# Patient Record
Sex: Female | Born: 1960 | Race: White | Hispanic: No | Marital: Married | Smoking: Never smoker
Health system: Southern US, Community
[De-identification: ages and names within clinical notes are randomized; demographics above are authoritative.]

## PROBLEM LIST (undated history)

## (undated) DIAGNOSIS — K219 Gastro-esophageal reflux disease without esophagitis: Secondary | ICD-10-CM

## (undated) DIAGNOSIS — R52 Pain, unspecified: Secondary | ICD-10-CM

## (undated) DIAGNOSIS — F419 Anxiety disorder, unspecified: Secondary | ICD-10-CM

## (undated) DIAGNOSIS — R112 Nausea with vomiting, unspecified: Secondary | ICD-10-CM

## (undated) DIAGNOSIS — T8859XA Other complications of anesthesia, initial encounter: Secondary | ICD-10-CM

## (undated) DIAGNOSIS — R51 Headache: Secondary | ICD-10-CM

## (undated) DIAGNOSIS — E669 Obesity, unspecified: Secondary | ICD-10-CM

## (undated) DIAGNOSIS — T4145XA Adverse effect of unspecified anesthetic, initial encounter: Secondary | ICD-10-CM

## (undated) DIAGNOSIS — Z9889 Other specified postprocedural states: Secondary | ICD-10-CM

## (undated) DIAGNOSIS — M549 Dorsalgia, unspecified: Secondary | ICD-10-CM

## (undated) DIAGNOSIS — K8020 Calculus of gallbladder without cholecystitis without obstruction: Secondary | ICD-10-CM

## (undated) DIAGNOSIS — B009 Herpesviral infection, unspecified: Secondary | ICD-10-CM

## (undated) DIAGNOSIS — I878 Other specified disorders of veins: Secondary | ICD-10-CM

## (undated) DIAGNOSIS — F909 Attention-deficit hyperactivity disorder, unspecified type: Secondary | ICD-10-CM

## (undated) DIAGNOSIS — E039 Hypothyroidism, unspecified: Secondary | ICD-10-CM

## (undated) DIAGNOSIS — E538 Deficiency of other specified B group vitamins: Secondary | ICD-10-CM

## (undated) DIAGNOSIS — J302 Other seasonal allergic rhinitis: Secondary | ICD-10-CM

## (undated) HISTORY — DX: Deficiency of other specified B group vitamins: E53.8

## (undated) HISTORY — PX: ENDOMETRIAL ABLATION: SHX621

## (undated) HISTORY — DX: Dorsalgia, unspecified: M54.9

## (undated) HISTORY — PX: OTHER SURGICAL HISTORY: SHX169

## (undated) HISTORY — DX: Herpesviral infection, unspecified: B00.9

---

## 1998-04-26 ENCOUNTER — Ambulatory Visit (HOSPITAL_COMMUNITY): Admission: RE | Admit: 1998-04-26 | Discharge: 1998-04-26 | Payer: Self-pay | Admitting: Family Medicine

## 1998-05-06 ENCOUNTER — Observation Stay (HOSPITAL_COMMUNITY): Admission: RE | Admit: 1998-05-06 | Discharge: 1998-05-07 | Payer: Self-pay | Admitting: Gynecology

## 1999-04-22 ENCOUNTER — Ambulatory Visit (HOSPITAL_COMMUNITY): Admission: RE | Admit: 1999-04-22 | Discharge: 1999-04-22 | Payer: Self-pay | Admitting: Family Medicine

## 1999-04-22 ENCOUNTER — Encounter: Payer: Self-pay | Admitting: Family Medicine

## 2000-08-06 ENCOUNTER — Other Ambulatory Visit: Admission: RE | Admit: 2000-08-06 | Discharge: 2000-08-06 | Payer: Self-pay | Admitting: Gynecology

## 2000-08-13 ENCOUNTER — Ambulatory Visit (HOSPITAL_COMMUNITY): Admission: RE | Admit: 2000-08-13 | Discharge: 2000-08-13 | Payer: Self-pay | Admitting: Family Medicine

## 2000-08-13 ENCOUNTER — Encounter: Payer: Self-pay | Admitting: Family Medicine

## 2002-03-27 ENCOUNTER — Encounter: Admission: RE | Admit: 2002-03-27 | Discharge: 2002-03-27 | Payer: Self-pay | Admitting: Gynecology

## 2002-03-27 ENCOUNTER — Encounter: Payer: Self-pay | Admitting: Gynecology

## 2002-12-22 ENCOUNTER — Other Ambulatory Visit: Admission: RE | Admit: 2002-12-22 | Discharge: 2002-12-22 | Payer: Self-pay | Admitting: Gynecology

## 2003-03-13 ENCOUNTER — Emergency Department (HOSPITAL_COMMUNITY): Admission: EM | Admit: 2003-03-13 | Discharge: 2003-03-13 | Payer: Self-pay

## 2003-12-10 ENCOUNTER — Other Ambulatory Visit: Admission: RE | Admit: 2003-12-10 | Discharge: 2003-12-10 | Payer: Self-pay | Admitting: Gynecology

## 2003-12-15 ENCOUNTER — Encounter: Admission: RE | Admit: 2003-12-15 | Discharge: 2003-12-15 | Payer: Self-pay

## 2003-12-29 ENCOUNTER — Ambulatory Visit (HOSPITAL_COMMUNITY): Admission: RE | Admit: 2003-12-29 | Discharge: 2003-12-29 | Payer: Self-pay | Admitting: General Surgery

## 2003-12-29 ENCOUNTER — Encounter (INDEPENDENT_AMBULATORY_CARE_PROVIDER_SITE_OTHER): Payer: Self-pay | Admitting: Specialist

## 2004-02-15 ENCOUNTER — Ambulatory Visit (HOSPITAL_COMMUNITY): Admission: RE | Admit: 2004-02-15 | Discharge: 2004-02-15 | Payer: Self-pay | Admitting: General Surgery

## 2004-04-01 ENCOUNTER — Encounter (INDEPENDENT_AMBULATORY_CARE_PROVIDER_SITE_OTHER): Payer: Self-pay | Admitting: Specialist

## 2004-04-01 ENCOUNTER — Ambulatory Visit (HOSPITAL_COMMUNITY): Admission: RE | Admit: 2004-04-01 | Discharge: 2004-04-01 | Payer: Self-pay | Admitting: General Surgery

## 2004-05-12 ENCOUNTER — Encounter: Admission: RE | Admit: 2004-05-12 | Discharge: 2004-05-12 | Payer: Self-pay | Admitting: Gynecology

## 2004-06-02 ENCOUNTER — Encounter (INDEPENDENT_AMBULATORY_CARE_PROVIDER_SITE_OTHER): Payer: Self-pay | Admitting: Specialist

## 2004-06-02 ENCOUNTER — Ambulatory Visit (HOSPITAL_COMMUNITY): Admission: RE | Admit: 2004-06-02 | Discharge: 2004-06-02 | Payer: Self-pay | Admitting: General Surgery

## 2004-08-22 ENCOUNTER — Ambulatory Visit (HOSPITAL_COMMUNITY): Admission: RE | Admit: 2004-08-22 | Discharge: 2004-08-22 | Payer: Self-pay | Admitting: Gastroenterology

## 2004-12-27 ENCOUNTER — Other Ambulatory Visit: Admission: RE | Admit: 2004-12-27 | Discharge: 2004-12-27 | Payer: Self-pay | Admitting: Gynecology

## 2005-04-27 ENCOUNTER — Encounter (INDEPENDENT_AMBULATORY_CARE_PROVIDER_SITE_OTHER): Payer: Self-pay | Admitting: *Deleted

## 2005-04-27 ENCOUNTER — Ambulatory Visit (HOSPITAL_COMMUNITY): Admission: RE | Admit: 2005-04-27 | Discharge: 2005-04-27 | Payer: Self-pay | Admitting: Gynecology

## 2005-04-27 ENCOUNTER — Ambulatory Visit (HOSPITAL_BASED_OUTPATIENT_CLINIC_OR_DEPARTMENT_OTHER): Admission: RE | Admit: 2005-04-27 | Discharge: 2005-04-27 | Payer: Self-pay | Admitting: Gynecology

## 2005-06-30 ENCOUNTER — Encounter: Admission: RE | Admit: 2005-06-30 | Discharge: 2005-06-30 | Payer: Self-pay | Admitting: Gynecology

## 2005-12-28 ENCOUNTER — Other Ambulatory Visit: Admission: RE | Admit: 2005-12-28 | Discharge: 2005-12-28 | Payer: Self-pay | Admitting: Gynecology

## 2006-07-02 ENCOUNTER — Encounter: Admission: RE | Admit: 2006-07-02 | Discharge: 2006-07-02 | Payer: Self-pay | Admitting: *Deleted

## 2006-12-31 ENCOUNTER — Other Ambulatory Visit: Admission: RE | Admit: 2006-12-31 | Discharge: 2006-12-31 | Payer: Self-pay | Admitting: Gynecology

## 2007-01-29 ENCOUNTER — Ambulatory Visit: Payer: Self-pay | Admitting: Psychology

## 2007-07-05 ENCOUNTER — Encounter: Admission: RE | Admit: 2007-07-05 | Discharge: 2007-07-05 | Payer: Self-pay | Admitting: Gynecology

## 2008-07-07 ENCOUNTER — Encounter: Admission: RE | Admit: 2008-07-07 | Discharge: 2008-07-07 | Payer: Self-pay | Admitting: Family Medicine

## 2009-07-16 ENCOUNTER — Encounter: Admission: RE | Admit: 2009-07-16 | Discharge: 2009-07-16 | Payer: Self-pay | Admitting: Gynecology

## 2010-07-21 ENCOUNTER — Encounter: Admission: RE | Admit: 2010-07-21 | Discharge: 2010-07-21 | Payer: Self-pay | Admitting: Gynecology

## 2010-07-27 ENCOUNTER — Encounter: Admission: RE | Admit: 2010-07-27 | Discharge: 2010-07-27 | Payer: Self-pay | Admitting: Family Medicine

## 2011-02-24 NOTE — Op Note (Signed)
NAME:  Bonnie Schwartz, Bonnie Schwartz             ACCOUNT NO.:  [id number]   MEDICAL RECORD NO.:  [id number]          PATIENT TYPE:  AMB   LOCATION:  ENDO                         FACILITY:  [facility]   PHYSICIAN:  John C. Madilyn Fireman, M.D.    DATE OF BIRTH:  [birth year]   DATE OF PROCEDURE:  08/22/2004  DATE OF DISCHARGE:                                 OPERATIVE REPORT   ENDOSCOPIST:  Everardo All. Madilyn Fireman, M.D.   PROCEDURE:  Colonoscopy.   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  The patient is to undergo  corrective surgery at [facility].  The procedure is requested based on rectal  bleeding and also to assess for possibility of inflammatory bowel disease.   PROCEDURE:  The patient was placed in the left lateral decubitus position  and placed on the pulse monitor with continuous low flow oxygen delivered by  nasal cannula.  She was sedated with 100 mcg of IV fentanyl and 8 mg of IV  Versed.  The Olympus video colonoscope was inserted into the rectum and  advanced to the cecum, confirmed by transillumination of McBurney's point  and visualization of the ileocecal valve and appendiceal orifice.  The prep  was excellent.  The terminal ileum was intubated and explored for several cm  and appeared to be within normal limits.  The cecum, ascending, transverse,  descending, sigmoid colon all appeared normal with no masses, polyps,  diverticula or other mucosal abnormalities.  The rectum, likewise, appeared  normal.  In retroflexed view, the anus revealed no obvious internal  hemorrhoids, no fistulas were seen internally within the rectum, however, on  withdrawal of the scope, as the scope was being withdrawn from the anal  sphincter, small fistula presumably to the vagina could be seen.  We did not  attempt to probe this with the endoscope.  There were also some small  external hemorrhoids.  The scope was then withdrawn and the patient was  returned to  the recovery room in stable condition.  She tolerated the  procedure well and there were no immediate complications.   IMPRESSION:  Very distal rectovaginal fistula, otherwise, normal  colonoscopy.  No direct evidence of Crohn's disease including the terminal  ileum.      JCH/MEDQ  D:  08/22/2004  T:  08/22/2004  Job:  [id number]   cc:   Sheppard Plumber. Earlene Plater, M.D.  [address]  [place]  [place] [address]  Fax: [phone]   Gretta Cool, M.D.  [address] [place]  [place] [address]  Fax: [phone]   Lafe Garin  [facility] - [address]  [place]  [place] [address]  Fax: [phone]

## 2011-02-24 NOTE — Op Note (Signed)
NAME:  Bonnie Schwartz, Bonnie Schwartz                       ACCOUNT NO.:  [id number]   MEDICAL RECORD NO.:  [id number]                   PATIENT TYPE:  AMB   LOCATION:  DAY                                  FACILITY:  [facility]   PHYSICIAN:  Timothy E. Earlene Plater, M.D.              DATE OF BIRTH:  Jul 28, 1961   DATE OF PROCEDURE:  02/15/2004  DATE OF DISCHARGE:                                 OPERATIVE REPORT   PREOPERATIVE DIAGNOSES:  Persistent perianal sepsis.   POSTOPERATIVE DIAGNOSES:  Rectal abscess, probable fistula-in-ano.   PROCEDURE:  Examination under anesthesia with ultrasound and external  drainage of abscess.   SURGEON:  Timothy E. Earlene Plater, M.D.   ANESTHESIA:  General.   INDICATIONS FOR PROCEDURE:  [name] is otherwise healthy, 24.  The  patient was seen in February, treated temporarily for a fissure, sepsis  developed, she was operated December 29, 2003 with debridement of fistula.  The  patient had been seen and followed closely, has developed more drainage with  discomfort, questionable masses present in the  perirectal area and this  surgery was advised.  The patient was seen, identified, and the permit  signed.   The patient was taken to the operating room, placed supine, LMA anesthesia  provided. She was placed in lithotomy, perianal area inspected, prepped and  draped in the usual fashion.  I used a loupe for visual magnification.  I  carefully examined the side walls of the posterior wall of the vagina which  was completely intact and normal. Examination of  the perianal skin revealed  it to be intact without external fistula. There was a palpable mass in the  left anterior perirectal tissue.  Anoscopy carried out showing the old  fistula tract laid open, the mucosa separated and the wound granulated and  cleaned.  However, moderate pressure on the left anterior perirectal skin  produced a small droplet of pus in the central part of the fistula tract. I  then performed anorectal  ultrasonography with multiple techniques and views.  Careful examination of these ultrasound photographs revealed no  abnormalities that I could see.   The ultrasound probe removed and then with anoscopy again, the rectal area  in the left anterior where the fistula tract appeared to be superficially  healing and granulating was again inspected, gentle probing in this area  revealed no evidence of fistula deep to the tissue.  I then made a small  incision in the external area and no pus was seen. I injected hydrogen  peroxide under pressure and none of that leaked into the rectal vault.  I  therefore extended that incision,bluntly probed this area outside the  sphincter and placed a 10 mm drain in this area and sutured it to the skin.  There was no bleeding or complications.  The dressing was applied and the  patient was taken to the recovery room.   I explained all of this  in detail to her husband with whom I have talked  before.  She will be given Augmentin 875, 1 b.i.d. for 10 days, 1 refill;  Percocet #36 to use as needed and will be seen in the office in 7-10 days.                                               Timothy E. Earlene Plater, M.D.    TED/MEDQ  D:  02/15/2004  T:  02/15/2004  Job:  [id number]   cc:   Gretta Cool, M.D.  [address] [place]  [place] [address]  Fax: [phone]

## 2011-02-24 NOTE — Op Note (Signed)
NAME:  Bonnie Schwartz, Bonnie Schwartz NO.:  [id number]   MEDICAL RECORD NO.:  [id number]          PATIENT TYPE:  AMB   LOCATION:  NESC                         FACILITY:  [facility]   PHYSICIAN:  Gretta Cool, M.D. DATE OF BIRTH:  10/22/1960   DATE OF PROCEDURE:  04/27/2005  DATE OF DISCHARGE:                                 OPERATIVE REPORT   PREOPERATIVE DIAGNOSIS:  Abnormal uterine bleeding, recurrent and  unresponsive to conservative therapy.   POSTOPERATIVE DIAGNOSIS:  Abnormal uterine bleeding, recurrent and  unresponsive to conservative therapy.   PROCEDURE:  Hysteroscopy and resection of endometrium total for ablation  plus Vapotrode ablation.   ANESTHESIA:  IV sedation and paracervical block.   SURGEON:  Beather Arbour, M.D.   DESCRIPTION OF PROCEDURE:  Under excellent anesthesia as above with the  patient prepped and draped in lithotomy position in [place] stirrups, the  cervix was grasped with a single-toothed tenaculum and pulled down into  view.  It was then progressively dilated with a series of Pratt dilators to  accommodate the 7-mm resectoscope.  The resectoscope was then introduced,  and the entire cavity photographed.  There was a polyp on the right lateral  fundal wall, somewhat broad and sessile.  The endometrial cavity was then  resected beginning in the cornual areas and continuing across the anterior  uterine wall until all of the polyp and endometrial tissue was resected down  5 mm or so into the endometrial tissue and myometrial tissue.  Once the  entire myometrium was resected, attention was turned to the bleeding sites,  and they were individually cauterized.  The Vapotrode electrode was then  applied, and the entire cavity treated with Vapotrode at 200 watts pure cut.  At the end of the procedure, the bleeding was well-controlled.  The entire  cavity had been treated and her all of endometrial tissue removed, and  Vapotrode used to eliminate any  viable islands of  endometrial tissue and  the surface myometrium.  At this point, the procedure was terminated without  complication.  Fluid deficit was 175 mL. Complications none.       CWL/MEDQ  D:  04/27/2005  T:  04/27/2005  Job:  [id number]   cc:   [facility]

## 2011-02-24 NOTE — Op Note (Signed)
NAME:  Bonnie Schwartz, Bonnie Schwartz                       ACCOUNT NO.:  [id number]   MEDICAL RECORD NO.:  [id number]                   PATIENT TYPE:  AMB   LOCATION:  DAY                                  FACILITY:  [facility]   PHYSICIAN:  Timothy E. Earlene Plater, M.D.              DATE OF BIRTH:  30-Sep-1961   DATE OF PROCEDURE:  06/02/2004  DATE OF DISCHARGE:                                 OPERATIVE REPORT   PREOPERATIVE DIAGNOSIS:  Persistent rectovaginal fistula.   POSTOPERATIVE DIAGNOSIS:  Persistent rectovaginal fistula.   OPERATIVE PROCEDURE:  1. Examination under anesthesia.  2. Open debridement and partial excision of fistula.  3. Mucosal advancement flap of rectum.   SURGEON:  Kendrick Ranch, M.D.   ANESTHESIA:  General.   INDICATIONS FOR PROCEDURE:  Bonnie Schwartz has been seen and followed for almost  1 year for persistent perianal sepsis.  Two prior procedures by me have been  frustrating and uneventful in that we have not been able to define the  fistula; however, the patient [place].  She was seen by Dr. Nicholas Lose a week ago and underwent  incision and drainage of an external abscess in the perineum.  I saw her in  the office 1 week ago.  She was to complete her course of antibiotics, being  Cipro and Flagyl and when seen yesterday in the office, there was clear  fluctuance in the perineum, a clear pustular discharge from the vagina, and  an obvious pustular discharge from the anterior rectal wall.  With these  findings, surgery was scheduled urgently as the fistula tract could now be  defined.  She agrees and understands and realizes injury to the anal rectal  sphincter mechanism, both by the infection and potentially by the surgery.  The patient was seen and identified and the permit signed.   She was taken to the operating room, placed supine, LMA anesthesia provided.  She was then placed in the lithotomy position, and the entire  perineum,  vagina, and rectal vault were prepped with Betadine.  Inspection into the  vagina revealed a punctate area just to the left of the midline in the  posterior vagina approximately 3 cm from the introitus.  A malleable probe  was placed, and it fell into a fistula tract.  I could not get it to pass  into the rectum and so then using a standard anoscope, the anterior rectal  wall just to the left of the midline was purulent, obviously had been  involved with an infectious process.  There was a 1 cm area of mucosa that  was partially necrotic.  Gentle probing with a hemostat revealed the fistula  tract and the malleable probe from the vagina fell through completing the  course of the rectovaginal fistula.   I then turned attention to the vagina where I opened the vaginal wall out  into the perineum and entered into a 1-1/2 cm wide in diameter fistula  cavity.  This area was scraped and debrided with bone curettes as deeply as  I could safely reach.  I then turned to the rectum with the operating  anoscope in place.  I excised the necrotic rectal mucosa.  I raised flaps of  the mucosa proximally and on either side and then was able to actually close  the rectal mucosa over this area of previous necrosis as a mucosal  advancement flap.  The sutures used were 4-0 Vicryl.  This was intact and  dry.  Then again from the vaginal side, I further debrided the fistula  tract.  I placed a blue vessel loop into the fistula cavity and sutured that  to the vaginal mucosa.  I then closed the perineal wound in layers.   So that the remaining fistula tract was open to the vagina with a loop drain  in place, the rectal portion was closed with a mucosal advancement flap.  Both areas were dressed with Gelfoam and dry sterile dressings.  The patient  has pain medication.  She will be placed on antibiotics and followed closely  as an outpatient.                                               Timothy  E. Earlene Plater, M.D.    TED/MEDQ  D:  06/02/2004  T:  06/02/2004  Job:  [id number]

## 2011-02-24 NOTE — Op Note (Signed)
NAME:  Bonnie Schwartz, Bonnie Schwartz                     ACCOUNT NO.:  [id number]   MEDICAL RECORD NO.:  [id number]                   PATIENT TYPE:  AMB   LOCATION:  DAY                                  FACILITY:  [facility]   PHYSICIAN:  Timothy E. Earlene Plater, M.D.              DATE OF BIRTH:  [birth year]   DATE OF PROCEDURE:  12/29/2003  DATE OF DISCHARGE:  12/29/2003                                 OPERATIVE REPORT   PREOPERATIVE DIAGNOSES:  Anal fissure versus anal fistula.   POSTOPERATIVE DIAGNOSES:  Fistula-in-ano.   OPERATION:  Examination under anesthesia, proctoscopy, debridement of  fistula.   SURGEON:  Timothy E. Earlene Plater, M.D.   ANESTHESIA:  General.   [name] is 50, has been seen and followed by Dr. Nicholas Lose and has been seen  in our office by Dr. Orson Slick.  Evaluation done, CT scan done unrevealing for  rectal pathology. Because of exquisite tenderness, she could not be examined  in the office and after careful discussion with me, she elected to proceed  with this procedure. She was seen, identified and evaluated by anesthesia.   The patient was taken to the operating room, placed supine, LMA anesthesia  provided. She was placed in lithotomy, perianal area carefully inspected,  prepped and draped in the usual fashion.  There was a lesion in the left  anterior anoderm and perianal skin.  By palpation, there was no abscess.  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 Proctoscopy was  carried out to 18 cm and was otherwise negative.  The operating anoscope  placed into the anal canal. There was no other pathology except in the left  anterior position. This superficial fistula was carefully examined for  evidence of other arborizing fistula, there were none, there was no abscess.  Though the patient had considerable symptoms and findings of sphincter  spasm, under the circumstances I elected not to do an internal  sphincterotomy.  I simply debrided the fistula, cauterized its  base,  provided it an external drainage with the cauterization and completed the  procedure. Tissue was sent for pathology.   A dry sterile dressing and Gelfoam were applied.  Careful written and verbal  instructions including Percocet #36 and the patient will be seen and  followed as an outpatient.                                               Timothy E. Earlene Plater, M.D.    TED/MEDQ  D:  01/12/2004  T:  01/12/2004  Job:  [id number]   cc:   Gretta Cool, M.D.  [address] [place]  [place] [address]  Fax: [phone]

## 2011-02-24 NOTE — Op Note (Signed)
NAME:  Bonnie Schwartz, Bonnie Schwartz                       ACCOUNT NO.:  [id number]   MEDICAL RECORD NO.:  [id number]                   PATIENT TYPE:  AMB   LOCATION:  DAY                                  FACILITY:  [facility]   PHYSICIAN:  Timothy E. Earlene Plater, M.D.              DATE OF BIRTH:  29-Jul-1961   DATE OF PROCEDURE:  04/01/2004  DATE OF DISCHARGE:                                 OPERATIVE REPORT   PREOPERATIVE DIAGNOSIS:  Recurrent fistula versus abscess of the rectum.   POSTOPERATIVE DIAGNOSIS:  Intrarectal abscess.   OPERATIVE PROCEDURE:  1. Examination under anesthesia.  2. Anorectal ultrasonography.  3. Debridement of intrarectal abscess.   SURGEON:  Kendrick Ranch, M.D.   ANESTHESIA:  General.   INDICATIONS FOR PROCEDURE:  Bonnie Schwartz has been seen and followed in my  office since March 2005 and has previously undergone two operative  procedures, first in March and then in May for persistent sepsis perianal.  On each occasion, an intrarectal abscess and/or internal fistulous opening  was found in the left anterior rectal wall and on two occasions external  abscess was found, diagnosed, and drained.  She has been on multiple  antibiotic courses and has provided good local care.  When last seen in the  office at the end of May, she had another external abscess in the left  anterior position but because she had to leave town for a funeral, she was  placed on antibiotics and local care, and this surgery was tentatively  scheduled.  Because of persistence of symptoms, she is here now for exam  under anesthesia, debridement as indicated.  She agrees and understands the  procedures, specifically in regards to the sphincter and the difficulty of  the surgery in this location because of the anorectal sphincter.  It is  notable that she also had a vaginoplasty with reconstruction approximately 6  years ago.  There has been no evidence of rectovaginal fistula at this time.   The patient was seen,  interviewed, and the permit signed.   She was taken to the operating room, placed supine, LMA anesthesia provided.  She was placed in lithotomy.  Perianal area was inspected first then prepped  and draped in the usual fashion.  There was a small, fine, well-healed scar  in the left anterior perianal skin.  There was no fullness, firmness, or  mass appreciated in this ara.  Careful inspection of the vagina again was  completely normal, showing no defects in the vaginal mucosa.  The anorectal  exam was carried out first with digital then anoscopic exam.  Between the 12  and 1 o'clock positions in the left anterior, there was an active, open  internal fistula versus abscess.  This area was gently probed with a  malleable probe and would go no deeper than 1-2 mm.  Pressure on the left  anterior external site revealed no evidence of pus or pustule  material.  I  then removed the anoscope and placed the anorectal ultrasound, both with a  solid anal probe and the balloon rectal probe and could detect no defects in  the mucosa, no evidence for abscesses or fistula.  Multiple pictures were  made with several techniques, and no defects were seen.  The ultrasound  probe was removed.  The anoscope was reintroduced and, again, gentle  debridement of the intrarectal abscess was accomplished.  Small fragments of  tissue that were debrided were sent to the laboratory for pathologic  diagnosis.  Again, the probe would not penetrate more than now 2-3 mm deep.  It did not probe into the left anterior perianal space and did not probe  into the vaginal area.  Therefore, the procedure was complete, as planned,  according to our understanding with the patient and myself that we would do  no radical surgery; we would not injure the sphincter and if less evidence  proved there to be a fistulous tract, we would not create one.  Counts were  correct.  She tolerated it well.  She was awakened and taken to the  recovery  room in good condition.   She was given instructions, Mepergan Forte for pain, and to continue  Augmentin, and will be followed in the office.                                               Timothy E. Earlene Plater, M.D.    TED/MEDQ  D:  04/01/2004  T:  04/01/2004  Job:  [id number]

## 2011-06-20 ENCOUNTER — Other Ambulatory Visit: Payer: Self-pay | Admitting: Family Medicine

## 2011-06-20 DIAGNOSIS — Z1231 Encounter for screening mammogram for malignant neoplasm of breast: Secondary | ICD-10-CM

## 2011-07-25 ENCOUNTER — Ambulatory Visit
Admission: RE | Admit: 2011-07-25 | Discharge: 2011-07-25 | Disposition: A | Discharge status: Home / Self Care | Payer: BC Managed Care – PPO | Source: Ambulatory Visit | Attending: Family Medicine | Admitting: Family Medicine

## 2011-07-25 DIAGNOSIS — Z1231 Encounter for screening mammogram for malignant neoplasm of breast: Secondary | ICD-10-CM

## 2011-08-17 ENCOUNTER — Other Ambulatory Visit: Payer: Self-pay | Admitting: Gynecology

## 2012-01-04 ENCOUNTER — Other Ambulatory Visit: Payer: Self-pay | Admitting: Family Medicine

## 2012-06-26 ENCOUNTER — Other Ambulatory Visit: Payer: Self-pay | Admitting: Gynecology

## 2012-06-26 DIAGNOSIS — Z1231 Encounter for screening mammogram for malignant neoplasm of breast: Secondary | ICD-10-CM

## 2012-07-25 ENCOUNTER — Ambulatory Visit
Admission: RE | Admit: 2012-07-25 | Discharge: 2012-07-25 | Disposition: A | Discharge status: Home / Self Care | Payer: BC Managed Care – PPO | Source: Ambulatory Visit | Attending: Gynecology | Admitting: Gynecology

## 2012-07-25 DIAGNOSIS — Z1231 Encounter for screening mammogram for malignant neoplasm of breast: Secondary | ICD-10-CM

## 2012-08-12 ENCOUNTER — Emergency Department (HOSPITAL_COMMUNITY)
Admission: EM | Admit: 2012-08-12 | Discharge: 2012-08-12 | Discharge status: Against Medical Advice | Payer: BC Managed Care – PPO | Attending: Emergency Medicine | Admitting: Emergency Medicine
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
## 2012-08-19 ENCOUNTER — Other Ambulatory Visit: Payer: Self-pay | Admitting: Gynecology

## 2013-06-16 ENCOUNTER — Other Ambulatory Visit: Payer: Self-pay

## 2013-06-16 DIAGNOSIS — Z1231 Encounter for screening mammogram for malignant neoplasm of breast: Secondary | ICD-10-CM

## 2013-07-09 ENCOUNTER — Encounter: Payer: Self-pay | Admitting: Gynecology

## 2013-07-09 ENCOUNTER — Ambulatory Visit (INDEPENDENT_AMBULATORY_CARE_PROVIDER_SITE_OTHER): Payer: BC Managed Care – PPO | Admitting: Gynecology

## 2013-07-09 VITALS — BP 120/78 | Ht 65.0 in | Wt 179.0 lb

## 2013-07-09 DIAGNOSIS — Z23 Encounter for immunization: Secondary | ICD-10-CM

## 2013-07-09 DIAGNOSIS — Z01419 Encounter for gynecological examination (general) (routine) without abnormal findings: Secondary | ICD-10-CM

## 2013-07-09 DIAGNOSIS — N951 Menopausal and female climacteric states: Secondary | ICD-10-CM

## 2013-07-09 DIAGNOSIS — Z780 Asymptomatic menopausal state: Secondary | ICD-10-CM

## 2013-07-09 NOTE — Patient Instructions (Addendum)
Hormone Therapy At menopause, your body begins making less estrogen and progesterone hormones. This causes the body to stop having menstrual periods. This is because estrogen and progesterone hormones control your periods and menstrual cycle. A lack of estrogen may cause symptoms such as:  Hot flushes (or hot flashes).  Vaginal dryness.  Dry skin.  Loss of sex drive.  Risk of bone loss (osteoporosis). When this happens, you may choose to take hormone therapy to get back the estrogen lost during menopause. When the hormone estrogen is given alone, it is usually referred to as ET (Estrogen Therapy). When the hormone progestin is combined with estrogen, it is generally called HT (Hormone Therapy). This was formerly known as hormone replacement therapy (HRT). Your caregiver can help you make a decision on what will be best for you. The decision to use HT seems to change often as new studies are done. Many studies do not agree on the benefits of hormone replacement therapy. LIKELY BENEFITS OF HT INCLUDE PROTECTION FROM:  Hot Flushes (also called hot flashes) - A hot flush is a sudden feeling of heat that spreads over the face and body. The skin may redden like a blush. It is connected with sweats and sleep disturbance. Women going through menopause may have hot flushes a few times a month or several times per day depending on the woman.  Osteoporosis (bone loss)- Estrogen helps guard against bone loss. After menopause, a woman's bones slowly lose calcium and become weak and brittle. As a result, bones are more likely to break. The hip, wrist, and spine are affected most often. Hormone therapy can help slow bone loss after menopause. Weight bearing exercise and taking calcium with vitamin D also can help prevent bone loss. There are also medications that your caregiver can prescribe that can help prevent osteoporosis.  Vaginal Dryness - Loss of estrogen causes changes in the vagina. Its lining may  become thin and dry. These changes can cause pain and bleeding during sexual intercourse. Dryness can also lead to infections. This can cause burning and itching. (Vaginal estrogen treatment can help relieve pain, itching, and dryness.)  Urinary Tract Infections are more common after menopause because of lack of estrogen. Some women also develop urinary incontinence because of low estrogen levels in the vagina and bladder.  Possible other benefits of estrogen include a positive effect on mood and short-term memory in women. RISKS AND COMPLICATIONS  Using estrogen alone without progesterone causes the lining of the uterus to grow. This increases the risk of lining of the uterus (endometrial) cancer. Your caregiver should give another hormone called progestin if you have a uterus.  Women who take combined (estrogen and progestin) HT appear to have an increased risk of breast cancer. The risk appears to be small, but increases throughout the time that HT is taken.  Combined therapy also makes the breast tissue slightly denser which makes it harder to read mammograms (breast X-rays).  Combined, estrogen and progesterone therapy can be taken together every day, in which case there may be spotting of blood. HT therapy can be taken cyclically in which case you will have menstrual periods. Cyclically means HT is taken for a set amount of days, then not taken, then this process is repeated.  HT may increase the risk of stroke, heart attack, breast cancer and forming blood clots in your leg.  Transdermal estrogen (estrogen that is absorbed through the skin with a patch or a cream) may have more positive results with:    Cholesterol.  Blood pressure.  Blood clots. Having the following conditions may indicate you should not have HT:  Endometrial cancer.  Liver disease.  Breast cancer.  Heart disease.  History of blood clots.  Stroke. TREATMENT   If you choose to take HT and have a uterus,  usually estrogen and progestin are prescribed.  Your caregiver will help you decide the best way to take the medications.  Possible ways to take estrogen include:  Pills.  Patches.  Gels.  Sprays.  Vaginal estrogen cream, rings and tablets.  It is best to take the lowest dose possible that will help your symptoms and take them for the shortest period of time that you can.  Hormone therapy can help relieve some of the problems (symptoms) that affect women at menopause. Before making a decision about HT, talk to your caregiver about what is best for you. Be well informed and comfortable with your decisions. HOME CARE INSTRUCTIONS   Follow your caregivers advice when taking the medications.  A Pap test is done to screen for cervical cancer.  The first Pap test should be done at age 15.  Between ages 65 and 68, Pap tests are repeated every 2 years.  Beginning at age 27, you are advised to have a Pap test every 3 years as long as your past 3 Pap tests have been normal.  Some women have medical problems that increase the chance of getting cervical cancer. Talk to your caregiver about these problems. It is especially important to talk to your caregiver if a new problem develops soon after your last Pap test. In these cases, your caregiver may recommend more frequent screening and Pap tests.  The above recommendations are the same for women who have or have not gotten the vaccine for HPV (Human Papillomavirus).  If you had a hysterectomy for a problem that was not a cancer or a condition that could lead to cancer, then you no longer need Pap tests. However, even if you no longer need a Pap test, a regular exam is a good idea to make sure no other problems are starting.   If you are between ages 58 and 56, and you have had normal Pap tests going back 10 years, you no longer need Pap tests. However, even if you no longer need a Pap test, a regular exam is a good idea to make sure no  other problems are starting.   If you have had past treatment for cervical cancer or a condition that could lead to cancer, you need Pap tests and screening for cancer for at least 20 years after your treatment.  If Pap tests have been discontinued, risk factors (such as a new sexual partner) need to be re-assessed to determine if screening should be resumed.  Some women may need screenings more often if they are at high risk for cervical cancer.  Get mammograms done as per the advice of your caregiver. SEEK IMMEDIATE MEDICAL CARE IF:  You develop abnormal vaginal bleeding.  You have pain or swelling in your legs, shortness of breath, or chest pain.  You develop dizziness or headaches.  You have lumps or changes in your breasts or armpits.  You have slurred speech.  You develop weakness or numbness of your arms or legs.  You have pain, burning, or bleeding when urinating.  You develop abdominal pain. Document Released: 06/24/2003 Document Revised: 12/18/2011 Document Reviewed: 10/12/2010 [facility] Patient Information 2014 [place], [place]. Menopause Menopause is the normal time  of life when menstrual periods stop completely. Menopause is complete when you have missed 12 consecutive menstrual periods. It usually occurs between the ages of 5 to 12, with an average age of 42. Very rarely does a woman develop menopause before 51 years old. At menopause, your ovaries stop producing the female hormones, estrogen and progesterone. This can cause undesirable symptoms and also affect your health. Sometimes the symptoms may occur 4 to 5 years before the menopause begins. There is no relationship between menopause and:  Oral contraceptives.  Number of children you had.  Race.  The age your menstrual periods started (menarche). Heavy smokers and very thin women may develop menopause earlier in life. CAUSES  The ovaries stop producing the female hormones estrogen and  progesterone.  Other causes include:  Surgery to remove both ovaries.  The ovaries stop functioning for no known reason.  Tumors of the pituitary gland in the brain.  Medical disease that affects the ovaries and hormone production.  Radiation treatment to the abdomen or pelvis.  Chemotherapy that affects the ovaries. SYMPTOMS   Hot flashes.  Night sweats.  Decrease in sex drive.  Vaginal dryness and thinning of the vagina causing painful intercourse.  Dryness of the skin and developing wrinkles.  Headaches.  Tiredness.  Irritability.  Memory problems.  Weight gain.  Bladder infections.  Hair growth of the face and chest.  Infertility. More serious symptoms include:  Loss of bone (osteoporosis) causing breaks (fractures).  Depression.  Hardening and narrowing of the arteries (atherosclerosis) causing heart attacks and strokes. DIAGNOSIS   When the menstrual periods have stopped for 12 straight months.  Physical exam.  Hormone studies of the blood. TREATMENT  There are many treatment choices and nearly as many questions about them. The decisions to treat or not to treat menopausal changes is an individual choice made with your caregiver. Your caregiver can discuss the treatments with you. Together, you can decide which treatment will work best for you. Your treatment choices may include:   Hormone therapy (estorgen and progesterone).  Non-hormonal medications.  Treating the individual symptoms with medication (for example antidepressants for depression).  Herbal medications that may help specific symptoms.  Counseling by a psychiatrist or psychologist.  Group therapy.  Lifestyle changes including:  Eating healthy.  Regular exercise.  Limiting caffeine and alcohol.  Stress management and meditation.  No treatment. HOME CARE INSTRUCTIONS   Take the medication your caregiver gives you as directed.  Get plenty of sleep and  rest.  Exercise regularly.  Eat a diet that contains calcium (good for the bones) and soy products (acts like estrogen hormone).  Avoid alcoholic beverages.  Do not smoke.  If you have hot flashes, dress in layers.  Take supplements, calcium and vitamin D to strengthen bones.  You can use over-the-counter lubricants or moisturizers for vaginal dryness.  Group therapy is sometimes very helpful.  Acupuncture may be helpful in some cases. SEEK MEDICAL CARE IF:   You are not sure you are in menopause.  You are having menopausal symptoms and need advice and treatment.  You are still having menstrual periods after age 50.  You have pain with intercourse.  Menopause is complete (no menstrual period for 12 months) and you develop vaginal bleeding.  You need a referral to a specialist (gynecologist, psychiatrist or psychologist) for treatment. SEEK IMMEDIATE MEDICAL CARE IF:   You have severe depression.  You have excessive vaginal bleeding.  You fell and think you have a broken  bone.  You have pain when you urinate.  You develop leg or chest pain.  You have a fast pounding heart beat (palpitations).  You have severe headaches.  You develop vision problems.  You feel a lump in your breast.  You have abdominal pain or severe indigestion. Document Released: 12/16/2003 Document Revised: 12/18/2011 Document Reviewed: 07/23/2008 [facility] Patient Information 2014 [place], [place]. Influenza Vaccine (Flu Vaccine, Inactivated) 2013 2014 What You Need to Know WHY GET VACCINATED?  Influenza ("flu") is a contagious disease that spreads around the [place] every winter, usually between October and May.  Flu is caused by the influenza virus, and can be spread by coughing, sneezing, and close contact.  Anyone can get flu, but the risk of getting flu is highest among children. Symptoms come on suddenly and may last several days. They can include:  Fever or chills.  Sore  throat.  Muscle aches.  Fatigue.  Cough.  Headache.  Runny or stuffy nose. Flu can make some people much sicker than others. These people include young children, people 65 and older, pregnant women, and people with certain health conditions such as heart, lung or kidney disease, or a weakened immune system. Flu vaccine is especially important for these people, and anyone in close contact with them. Flu can also lead to pneumonia, and make existing medical conditions worse. It can cause diarrhea and seizures in children. Each year thousands of people in the [place] die from flu, and many more are hospitalized. Flu vaccine is the best protection we have from flu and its complications. Flu vaccine also helps prevent spreading flu from person to person. INACTIVATED FLU VACCINE There are 2 types of influenza vaccine:  You are getting an inactivated flu vaccine, which does not contain any live influenza virus. It is given by injection with a needle, and often called the "flu shot."  A different live, attenuated (weakened) influenza vaccine is sprayed into the nostrils. This vaccine is described in a separate Vaccine Information Statement. Flu vaccine is recommended every year. Children 6 months through 44 years of age should get 2 doses the first year they get vaccinated. Flu viruses are always changing. Each year's flu vaccine is made to protect from viruses that are most likely to cause disease that year. While flu vaccine cannot prevent all cases of flu, it is our best defense against the disease. Inactivated flu vaccine protects against 3 or 4 different influenza viruses. It takes about 2 weeks for protection to develop after the vaccination, and protection lasts several months to a year. Some illnesses that are not caused by influenza virus are often mistaken for flu. Flu vaccine will not prevent these illnesses. It can only prevent influenza. A "high-dose" flu vaccine is available for  people 35 years of age and older. The person giving you the vaccine can tell you more about it. Some inactivated flu vaccine contains a very small amount of a mercury-based preservative called thimerosal. Studies have shown that thimerosal in vaccines is not harmful, but flu vaccines that do not contain a preservative are available. SOME PEOPLE SHOULD NOT GET THIS VACCINE Tell the person who gives you the vaccine:  If you have any severe (life-threatening) allergies. If you ever had a life-threatening allergic reaction after a dose of flu vaccine, or have a severe allergy to any part of this vaccine, you may be advised not to get a dose. Most, but not all, types of flu vaccine contain a small amount of egg.  If you ever had Guillain Barr Syndrome (a severe paralyzing illness, also called GBS). Some people with a history of GBS should not get this vaccine. This should be discussed with your doctor.  If you are not feeling well. They might suggest waiting until you feel better. But you should come back. RISKS OF A VACCINE REACTION With a vaccine, like any medicine, there is a chance of side effects. These are usually mild and go away on their own. Serious side effects are also possible, but are very rare. Inactivated flu vaccine does not contain live flu virus, sogetting flu from this vaccine is not possible. Brief fainting spells and related symptoms (such as jerking movements) can happen after any medical procedure, including vaccination. Sitting or lying down for about 15 minutes after a vaccination can help prevent fainting and injuries caused by falls. Tell your doctor if you feel dizzy or lightheaded, or have vision changes or ringing in the ears. Mild problems following inactivated flu vaccine:  Soreness, redness, or swelling where the shot was given.  Hoarseness; sore, red or itchy eyes; or cough.  Fever.  Aches.  Headache.  Itching.  Fatigue. If these problems occur, they usually  begin soon after the shot and last 1 or 2 days. Moderate problems following inactivated flu vaccine:  Young children who get inactivated flu vaccine and pneumococcal vaccine (PCV13) at the same time may be at increased risk for seizures caused by fever. Ask your doctor for more information. Tell your doctor if a child who is getting flu vaccine has ever had a seizure. Severe problems following inactivated flu vaccine:  A severe allergic reaction could occur after any vaccine (estimated less than 1 in a million doses).  There is a small possibility that inactivated flu vaccine could be associated with Guillan Barr Syndrome (GBS), no more than 1 or 2 cases per million people vaccinated. This is much lower than the risk of severe complications from flu, which can be prevented by flu vaccine. The safety of vaccines is always being monitored. For more information, visit: http://floyd.org/ WHAT IF THERE IS A SERIOUS REACTION? What should I look for?  Look for anything that concerns you, such as signs of a severe allergic reaction, very high fever, or behavior changes. Signs of a severe allergic reaction can include hives, swelling of the face and throat, difficulty breathing, a fast heartbeat, dizziness, and weakness. These would start a few minutes to a few hours after the vaccination. What should I do?  If you think it is a severe allergic reaction or other emergency that cannot wait, call 9 1 1  or get the person to the nearest hospital. Otherwise, call your doctor.  Afterward, the reaction should be reported to the Vaccine Adverse Event Reporting System (VAERS). Your doctor might file this report, or you can do it yourself through the VAERS website at www.vaers.LAgents.no, or by calling 1-321-541-9633. VAERS is only for reporting reactions. They do not give medical advice. THE NATIONAL VACCINE INJURY COMPENSATION PROGRAM The National Vaccine Injury Compensation Program (VICP) is a federal  program that was created to compensate people who may have been injured by certain vaccines. Persons who believe they may have been injured by a vaccine can learn about the program and about filing a claim by calling 1-2024017994 or visiting the VICP website at SpiritualWord.at HOW CAN I LEARN MORE?  Ask your doctor.  Call your local or state health department.  Contact the Centers for Disease Control and Prevention (CDC):  Call 903-545-6439 (1-800-CDC-INFO) or  Visit CDC's website at BiotechRoom.com.cy CDC Inactivated Influenza Vaccine Interim VIS (05/03/12) Document Released: 07/20/2006 Document Revised: 06/19/2012 Document Reviewed: 05/03/2012 [facility] Patient Information 2014 [place], [place].

## 2013-07-09 NOTE — Progress Notes (Signed)
Bonnie Schwartz 07-Sep-1961 [id number]   History:    52 y.o.  for annual gyn exam new patient to the practice who was being followed by Dr. Nicholas Lose. Review of patient's records from Dr. Nicholas Lose indicated that patient has a history of pelvic organ prolapse treated by posterior repair and enterocele repair and also patient has had cardinal uterosacral colposuspension back in 1999. 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 Patient's primary physician is Dr. Fulton Mole who has been doing her blood work. Patient denies any prior history of abnormal Pap smears. Patient did state that at time of her last childbirth she had a large fourth degree tear. Patient has been complaining of hot flashes irritability insomnia and decreased libido but to a minor degree. She did have a FSH drawn last year by the other provider and she was in the early menopausal stage. She is not having any menstrual cycles for quite some time. Her husband has had a vasectomy. She had a colonoscopy July of this year which was normal in [facility]. Patient's last mammogram was in October 2013 and was normal. Patient states her Tdap vaccine was less than 10 years ago. Patient interested in receiving the flu vaccine today.  Past medical history,surgical history, family history and social history were all reviewed and documented in the EPIC chart.  Gynecologic History No LMP recorded. Patient has had an ablation. Contraception: post menopausal status Last Pap: 2013. Results were: normal Last mammogram: 2013. Results were: normal  Obstetric History OB History  Gravida Para Term Preterm AB SAB TAB Ectopic Multiple Living  3 2   1 1    2     # Outcome Date GA Lbr Len/2nd Weight Sex Delivery Anes PTL Lv  3 SAB           2 PAR           1 PAR                ROS: A ROS was performed and pertinent positives and negatives are included in the history.  GENERAL: No fevers or  chills. HEENT: No change in vision, no earache, sore throat or sinus congestion. NECK: No pain or stiffness. CARDIOVASCULAR: No chest pain or pressure. No palpitations. PULMONARY: No shortness of breath, cough or wheeze. GASTROINTESTINAL: No abdominal pain, nausea, vomiting or diarrhea, melena or bright red blood per rectum. GENITOURINARY: No urinary frequency, urgency, hesitancy or dysuria. MUSCULOSKELETAL: No joint or muscle pain, no back pain, no recent trauma. DERMATOLOGIC: No rash, no itching, no lesions. ENDOCRINE: No polyuria, polydipsia, no heat or cold intolerance. No recent change in weight. HEMATOLOGICAL: No anemia or easy bruising or bleeding. NEUROLOGIC: No headache, seizures, numbness, tingling or weakness. PSYCHIATRIC: No depression, no loss of interest in normal activity or change in sleep pattern.     Exam: chaperone present  BP 120/78  Ht 5\' 5"  (1.651 m)  Wt 179 lb (81.194 kg)  BMI 29.79 kg/m2  Body mass index is 29.79 kg/(m^2).  General appearance : Well developed well nourished female. No acute distress HEENT: Neck supple, trachea midline, no carotid bruits, no thyroidmegaly Lungs: Clear to auscultation, no rhonchi or wheezes, or rib retractions  Heart: Regular rate and rhythm, no murmurs or gallops Breast:Examined in sitting and supine position were symmetrical in appearance, no palpable masses or tenderness,  no skin retraction, no nipple inversion, no nipple discharge, no skin discoloration, no axillary or supraclavicular lymphadenopathy Abdomen: no palpable  masses or tenderness, no rebound or guarding Extremities: no edema or skin discoloration or tenderness  Pelvic:  Bartholin, Urethra, Skene Glands: Within normal limits             Vagina: No gross lesions or discharge  Cervix: No gross lesions or discharge  Uterus  anteverted, normal size, shape and consistency, non-tender and mobile  Adnexa  Without masses or tenderness  Anus and perineum  normal   Rectovaginal   normal sphincter tone without palpated masses or tenderness             Hemoccult colonoscopy this year     Assessment/Plan:  52 y.o. female for annual exam who is early menopause last years having symptoms this year of insomnia decreased libido and some mild vaginal dryness. We had a lengthy discussion of the women's health initiative study outlining the risks benefits pros and cons of hormone replacement therapy. Literature and information was provided no several products such as Osphena, Duavee and Elestrin which she is going to repeat and let us know if her symptoms worsen which way to proceed or oral hormone replacement therapy. We will check her [facility] today for  for confirmation since her [facility] was done at another facility last year. We discussed importance of monthly self breast examination. Patient was reminded to schedule her mammogram. Pap smear not done today and according to the new guidelines. Patient received the flu vaccine today.    Ok Edwards MD, 6:24 PM 07/09/2013

## 2013-07-10 LAB — FOLLICLE STIMULATING HORMONE: FSH: 47.2 m[IU]/mL

## 2013-07-28 ENCOUNTER — Ambulatory Visit
Admission: RE | Admit: 2013-07-28 | Discharge: 2013-07-28 | Disposition: A | Discharge status: Home / Self Care | Payer: BC Managed Care – PPO | Source: Ambulatory Visit

## 2013-07-28 DIAGNOSIS — Z1231 Encounter for screening mammogram for malignant neoplasm of breast: Secondary | ICD-10-CM

## 2013-09-01 ENCOUNTER — Encounter: Payer: Self-pay | Admitting: Anesthesiology

## 2013-11-25 ENCOUNTER — Ambulatory Visit (INDEPENDENT_AMBULATORY_CARE_PROVIDER_SITE_OTHER): Payer: BC Managed Care – PPO | Admitting: General Surgery

## 2013-12-11 ENCOUNTER — Ambulatory Visit (INDEPENDENT_AMBULATORY_CARE_PROVIDER_SITE_OTHER): Payer: BC Managed Care – PPO | Admitting: General Surgery

## 2013-12-11 ENCOUNTER — Encounter (INDEPENDENT_AMBULATORY_CARE_PROVIDER_SITE_OTHER): Payer: Self-pay | Admitting: General Surgery

## 2013-12-11 VITALS — BP 110/80 | HR 75 | Temp 97.0°F | Resp 14 | Ht 65.0 in | Wt 183.0 lb

## 2013-12-11 DIAGNOSIS — M62838 Other muscle spasm: Secondary | ICD-10-CM

## 2013-12-11 DIAGNOSIS — K594 Anal spasm: Secondary | ICD-10-CM

## 2013-12-11 NOTE — Progress Notes (Signed)
Chief Complaint  Patient presents with  . anal fistula    new pt restablish    HISTORY: Bonnie Schwartz is a 53 y.o. female who presents to the office with change in bowel habits.  Other symptoms include pain.  This had been occurring for about 5 months.  She has tried proctosol in the past with some success.  In 1986 she had a 4th degree tear with repair x2.  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  She describes the pain as a continuous dull ache on her left side. This is similar to pain that she has had in the past after surgery.    It is intermittent in nature.  Her bowel habits are regular and her bowel movements are soft.  Her fiber intake is good.  Her last colonoscopy was last July.  She is due again in 10 yrs.      Past Medical History  Diagnosis Date  . Hormone disorder   . Vitamin B12 deficiency   . Back pain   . Herpes       Past Surgical History  Procedure Laterality Date  . Pelvic floor surgery      X2  . Av fistula repair    . Endometrial ablation          Current Outpatient Prescriptions  Medication Sig Dispense Refill  . ALPRAZolam (XANAX) 0.5 MG tablet Take 0.5 mg by mouth as needed for sleep.      . Cyanocobalamin (VITAMIN B 12 PO) Take by mouth.      . levothyroxine (SYNTHROID, LEVOTHROID) 88 MCG tablet Take 88 mcg by mouth daily before breakfast.      . LORazepam (ATIVAN) 1 MG tablet Take 1 mg by mouth every 8 (eight) hours.      . phentermine 37.5 MG capsule Take 37.5 mg by mouth every morning.      . valACYclovir (VALTREX) 1000 MG tablet Take 1,000 mg by mouth as needed.       No current facility-administered medications for this visit.      Allergies  Allergen Reactions  . Erythromycin   . Minocycline       Family History  Problem Relation Age of Onset  . Cancer Mother 55    UTERINE  . Diabetes Mother   . Hypertension Mother   . Breast cancer Maternal Grandmother   . Cancer Maternal Grandmother 50    OVARIAN    History   Social History  . Marital Status: Married    Spouse Name: N/A    Number of Children: N/A  . Years of Education: N/A   Social History Main Topics  . Smoking status: Never Smoker   . Smokeless tobacco: Never Used  . Alcohol Use: No  . Drug Use: No  . Sexual Activity: Yes   Other Topics Concern  . None   Social History Narrative  . None      REVIEW OF SYSTEMS - PERTINENT POSITIVES ONLY: Review of Systems - General ROS: negative for - chills, fever or weight loss Hematological and Lymphatic ROS: negative for - bleeding problems, blood clots or bruising Respiratory ROS: no cough, shortness of breath, or wheezing Cardiovascular ROS: no chest pain or dyspnea on exertion Gastrointestinal ROS: no abdominal pain, change in bowel habits, or black or bloody stools Genito-Urinary ROS: no dysuria, trouble voiding, or hematuria  EXAM: Filed Vitals:   12/11/13 1038  BP: 110/80  Pulse:  75  Temp: 97 F (36.1 C)  Resp: 14    General appearance: alert and cooperative Resp: clear to auscultation bilaterally Cardio: regular rate and rhythm GI: normal findings: soft, non-tender   Procedure: Anoscopy Surgeon: Maisie Fushomas Diagnosis: anal pain  Assistant:  After the risks and benefits were explained, verbal consent was obtained for above procedure  Anesthesia: none Findings: Mildly Inflamed right anterior right posterior internal hemorrhoids.  Scar noted anteriorly. This appears to be well-healed. No signs of recurrent fistula. No masses.  Pain reproduced with palpation of left levator.    ASSESSMENT AND PLAN: Bonnie Schwartz Is a 53 year old female status post multiple perineal surgeries for a fourth degree laceration during childbirth and rectovaginal fistula. 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 It appears that her left levator is in spasm. I do not exactly know why she is having irritation of this muscle. I have given her some  exercises to do a home to her lack the muscle. He she will continue to use Proctosol as this seems to have given her some relief. We discussed the possibility of physical therapy to assist with relaxing the muscle. She will call the office in the next couple weeks if she would like to proceed with this. I will see her back in a couple months to evaluate her progress.    Vanita PandaAlicia C Dshaun Reppucci, MD Colon and Rectal Surgery / General Surgery [facility]      Visit Diagnoses: 1. Levator spasm     Primary Care Physician: Lolita PatellaEADE,ROBERT ALEXANDER, MD

## 2013-12-11 NOTE — Patient Instructions (Signed)
See handout on levator spasm exercises

## 2014-02-06 ENCOUNTER — Encounter (INDEPENDENT_AMBULATORY_CARE_PROVIDER_SITE_OTHER): Payer: Self-pay | Admitting: General Surgery

## 2014-05-19 ENCOUNTER — Other Ambulatory Visit: Payer: Self-pay | Admitting: Family Medicine

## 2014-05-19 DIAGNOSIS — R1011 Right upper quadrant pain: Secondary | ICD-10-CM

## 2014-05-20 ENCOUNTER — Ambulatory Visit
Admission: RE | Admit: 2014-05-20 | Discharge: 2014-05-20 | Disposition: A | Discharge status: Home / Self Care | Payer: BC Managed Care – PPO | Source: Ambulatory Visit | Attending: Family Medicine | Admitting: Family Medicine

## 2014-05-20 DIAGNOSIS — R1011 Right upper quadrant pain: Secondary | ICD-10-CM

## 2014-05-21 ENCOUNTER — Encounter (INDEPENDENT_AMBULATORY_CARE_PROVIDER_SITE_OTHER): Payer: Self-pay | Admitting: General Surgery

## 2014-05-21 ENCOUNTER — Ambulatory Visit (INDEPENDENT_AMBULATORY_CARE_PROVIDER_SITE_OTHER): Payer: BC Managed Care – PPO | Admitting: General Surgery

## 2014-05-21 VITALS — BP 124/68 | HR 68 | Temp 98.2°F | Ht 65.0 in | Wt 213.4 lb

## 2014-05-21 DIAGNOSIS — K8020 Calculus of gallbladder without cholecystitis without obstruction: Secondary | ICD-10-CM

## 2014-05-21 NOTE — Progress Notes (Signed)
Patient ID: Bonnie Schwartz, female   DOB: 1961/06/16, 53 y.o.   MRN: [id number]  Chief Complaint  Patient presents with  . Cholelithiasis    HPI Bonnie Kocheratricia L Carns is a 53 y.o. female.   HPI  She is referred by Dr. Nicholos Johnseade because of right upper quadrant pain and cholelithiasis.  She's had intermittent right upper quadrant pains for many years. However she's had 2 significant episodes of severe right upper quadrant pain associated with some nausea and vomiting after using a fatty meal. She now has intermittent episodes and had been last Sunday. He is tender last 3 hours. No fever or chills. Ultrasound demonstrated a large gallstone measuring 1.9 cm but no evidence of acute cholecystitis by ultrasound criteria. Changes consistent with fatty infiltration of the liver were noted.  Common bile duct diameter was normal. Her father had his gallbladder removed.  Past Medical History  Diagnosis Date  . Hormone disorder   . Vitamin B12 deficiency   . Back pain   . Herpes     Past Surgical History  Procedure Laterality Date  . Pelvic floor surgery      X2  . Av fistula repair    . Endometrial ablation      Family History  Problem Relation Age of Onset  . Cancer Mother 6560    UTERINE  . Diabetes Mother   . Hypertension Mother   . Breast cancer Maternal Grandmother   . Cancer Maternal Grandmother 3350    OVARIAN    Social History History  Substance Use Topics  . Smoking status: Never Smoker   . Smokeless tobacco: Never Used  . Alcohol Use: No    Allergies  Allergen Reactions  . Erythromycin   . Minocycline     Current Outpatient Prescriptions  Medication Sig Dispense Refill  . Cyanocobalamin (VITAMIN B 12 PO) Take by mouth.      . levothyroxine (SYNTHROID, LEVOTHROID) 88 MCG tablet Take 88 mcg by mouth daily before breakfast.      . LORazepam (ATIVAN) 1 MG tablet Take 1 mg by mouth every 8 (eight) hours.      . valACYclovir (VALTREX) 1000 MG tablet Take 1,000 mg by mouth as  needed.      . ALPRAZolam (XANAX) 0.5 MG tablet Take 0.5 mg by mouth as needed for sleep.      . phentermine 37.5 MG capsule Take 37.5 mg by mouth every morning.       No current facility-administered medications for this visit.    Review of Systems Review of Systems  Constitutional: Negative for fever and chills.       Weight gain  Respiratory: Negative.   Cardiovascular: Negative.   Gastrointestinal: Positive for nausea, vomiting and abdominal pain.    Blood pressure 124/68, pulse 68, temperature 98.2 F (36.8 C), temperature source Oral, height 5\' 5"  (1.651 m), weight 213 lb 6.4 oz (96.798 kg).  Physical Exam Physical Exam  Constitutional: No distress.  Obese female  HENT:  Head: Normocephalic and atraumatic.  Cardiovascular: Normal rate and regular rhythm.   Pulmonary/Chest: Effort normal and breath sounds normal.  Abdominal: Soft. She exhibits no mass. There is no tenderness.  Musculoskeletal: She exhibits no edema.  Neurological: She is alert.  Skin: Skin is warm and dry.    Data Reviewed Ultrasound report  Assessment    Symptomatic cholelithiasis. Symptoms are better if she is careful with her diet.     Plan    We discussed a laparoscopic  possible open cholecystectomy and she is interested in proceeding with this.I have explained the procedure, risks, and aftercare of cholecystectomy.  Risks include but are not limited to bleeding, infection, wound problems, anesthesia, diarrhea, bile leak, injury to common bile duct/liver/intestine.  She seems to understand and agrees to proceed.         Marleen Moret J 05/21/2014, 3:34 PM

## 2014-05-21 NOTE — Patient Instructions (Signed)
Strict low fat diet.   [facility] ______[facility] LAPAROSCOPIC SURGERY: POST OP INSTRUCTIONS Always review your discharge instruction sheet given to you by the facility where your surgery was performed. IF YOU HAVE DISABILITY OR FAMILY LEAVE FORMS, YOU MUST BRING THEM TO THE OFFICE FOR PROCESSING.   DO NOT GIVE THEM TO YOUR DOCTOR.  1. A prescription for pain medication may be given to you upon discharge.  Take your pain medication as prescribed, if needed.  If narcotic pain medicine is not needed, then you may take acetaminophen (Tylenol) or ibuprofen (Advil) as needed. 2. Take your usually prescribed medications unless otherwise directed. 3. If you need a refill on your pain medication, please contact your pharmacy.  They will contact our office to request authorization. Prescriptions will not be filled after 5pm or on week-ends. 4. You should follow a light diet the first few days after arrival home, such as soup and crackers, etc.  Be sure to include lots of fluids daily. 5. Most patients will experience some swelling and bruising in the area of the incisions.  Ice packs will help.  Swelling and bruising can take several days to resolve.  6. It is common to experience some constipation if taking pain medication after surgery.  Increasing fluid intake and taking a stool softener (such as Colace) will usually help or prevent this problem from occurring.  A mild laxative (Milk of Magnesia or Miralax) should be taken according to package instructions if there are no bowel movements after 48 hours. 7. Unless discharge instructions indicate otherwise, you may remove your bandages 24-48 hours after surgery, and you may shower at that time.  You may have steri-strips (small skin tapes) in place directly over the incision.  These strips should be left on the skin for 7-10 days.  If your surgeon used skin glue on the incision, you may shower in 24 hours.  The glue will flake off over the next  2-3 weeks.  Any sutures or staples will be removed at the office during your follow-up visit. 8. ACTIVITIES:  You may resume regular (light) daily activities beginning the next day-such as daily self-care, walking, climbing stairs-gradually increasing activities as tolerated.  You may have sexual intercourse when it is comfortable.  Refrain from any heavy lifting or straining until approved by your doctor. a. You may drive when you are no longer taking prescription pain medication, you can comfortably wear a seatbelt, and you can safely maneuver your car and apply brakes. b. RETURN TO WORK:  __________________________________________________________ 9. You should see your doctor in the office for a follow-up appointment approximately 2-3 weeks after your surgery.  Make sure that you call for this appointment within a day or two after you arrive home to insure a convenient appointment time. 10. OTHER INSTRUCTIONS: __________________________________________________________________________________________________________________________ __________________________________________________________________________________________________________________________ WHEN TO CALL YOUR DOCTOR: 1. Fever over 101.0 2. Inability to urinate 3. Continued bleeding from incision. 4. Increased pain, redness, or drainage from the incision. 5. Increasing abdominal pain  The clinic staff is available to answer your questions during regular business hours.  Please don't hesitate to call and ask to speak to one of the nurses for clinical concerns.  If you have a medical emergency, go to the nearest emergency room or call 911.  A surgeon from [facility] is always on call at the hospital. [address], [address], [place], [place]  [address] ? [address] [address], [place], [place]   [address] [phone] ? [phone] ?   FAX [phone] Web site: [email or web address] 

## 2014-06-09 ENCOUNTER — Encounter (HOSPITAL_COMMUNITY): Payer: Self-pay | Admitting: Pharmacy Technician

## 2014-06-16 ENCOUNTER — Encounter (HOSPITAL_COMMUNITY): Payer: Self-pay

## 2014-06-16 ENCOUNTER — Encounter (HOSPITAL_COMMUNITY)
Admission: RE | Admit: 2014-06-16 | Discharge: 2014-06-16 | Disposition: A | Discharge status: Home / Self Care | Payer: BC Managed Care – PPO | Source: Ambulatory Visit | Attending: General Surgery | Admitting: General Surgery

## 2014-06-16 DIAGNOSIS — K8010 Calculus of gallbladder with chronic cholecystitis without obstruction: Secondary | ICD-10-CM | POA: Diagnosis not present

## 2014-06-16 DIAGNOSIS — Z79899 Other long term (current) drug therapy: Secondary | ICD-10-CM | POA: Diagnosis not present

## 2014-06-16 DIAGNOSIS — K8020 Calculus of gallbladder without cholecystitis without obstruction: Secondary | ICD-10-CM | POA: Diagnosis present

## 2014-06-16 DIAGNOSIS — E559 Vitamin D deficiency, unspecified: Secondary | ICD-10-CM | POA: Diagnosis not present

## 2014-06-16 DIAGNOSIS — Z881 Allergy status to other antibiotic agents status: Secondary | ICD-10-CM | POA: Diagnosis not present

## 2014-06-16 HISTORY — DX: Other specified disorders of veins: I87.8

## 2014-06-16 HISTORY — DX: Hypothyroidism, unspecified: E03.9

## 2014-06-16 HISTORY — DX: Calculus of gallbladder without cholecystitis without obstruction: K80.20

## 2014-06-16 HISTORY — DX: Headache: R51

## 2014-06-16 HISTORY — DX: Gastro-esophageal reflux disease without esophagitis: K21.9

## 2014-06-16 HISTORY — DX: Other seasonal allergic rhinitis: J30.2

## 2014-06-16 HISTORY — DX: Pain, unspecified: R52

## 2014-06-16 LAB — COMPREHENSIVE METABOLIC PANEL
ALT: 20 U/L (ref 0–35)
ANION GAP: 14 (ref 5–15)
AST: 25 U/L (ref 0–37)
Albumin: 3.8 g/dL (ref 3.5–5.2)
Alkaline Phosphatase: 70 U/L (ref 39–117)
BUN: 12 mg/dL (ref 6–23)
CALCIUM: 9.8 mg/dL (ref 8.4–10.5)
CO2: 26 meq/L (ref 19–32)
CREATININE: 0.58 mg/dL (ref 0.50–1.10)
Chloride: 103 mEq/L (ref 96–112)
GFR calc Af Amer: >90 mL/min (ref >90)
Glucose, Bld: 91 mg/dL (ref 70–99)
Potassium: 4.5 mEq/L (ref 3.7–5.3)
Sodium: 143 mEq/L (ref 137–147)
Total Bilirubin: 0.3 mg/dL (ref 0.3–1.2)
Total Protein: 7.6 g/dL (ref 6.0–8.3)

## 2014-06-16 LAB — CBC WITH DIFFERENTIAL/PLATELET
BASOS PCT: 0 % (ref 0–1)
Basophils Absolute: 0 10*3/uL (ref 0.0–0.1)
EOS PCT: 2 % (ref 0–5)
Eosinophils Absolute: 0.1 10*3/uL (ref 0.0–0.7)
HEMATOCRIT: 41.7 % (ref 36.0–46.0)
Hemoglobin: 14.1 g/dL (ref 12.0–15.0)
LYMPHS PCT: 27 % (ref 12–46)
Lymphs Abs: 1.8 10*3/uL (ref 0.7–4.0)
MCH: 29 pg (ref 26.0–34.0)
MCHC: 33.8 g/dL (ref 30.0–36.0)
MCV: 85.8 fL (ref 78.0–100.0)
MONO ABS: 0.4 10*3/uL (ref 0.1–1.0)
Monocytes Relative: 6 % (ref 3–12)
Neutro Abs: 4.3 10*3/uL (ref 1.7–7.7)
Neutrophils Relative %: 65 % (ref 43–77)
Platelets: 263 10*3/uL (ref 150–400)
RBC: 4.86 MIL/uL (ref 3.87–5.11)
RDW: 14.1 % (ref 11.5–15.5)
WBC: 6.6 10*3/uL (ref 4.0–10.5)

## 2014-06-16 LAB — HCG, SERUM, QUALITATIVE: PREG SERUM: NEGATIVE

## 2014-06-16 LAB — PROTIME-INR
INR: 0.96 (ref 0.00–1.49)
PROTHROMBIN TIME: 12.8 s (ref 11.6–15.2)

## 2014-06-16 NOTE — Pre-Procedure Instructions (Signed)
EKG AND CXR NOT NEEDED PREOP PER ANESTHESIA GUIDELINES

## 2014-06-16 NOTE — Patient Instructions (Signed)
YOUR SURGERY IS SCHEDULED AT [facility]  ON:   Friday  9/11  REPORT TO  SHORT STAY CENTER AT:  7:30 AM   PLEASE COME IN THE [facility] MAIN HOSPITAL ENTRANCE AND FOLLOW SIGNS TO SHORT STAY CENTER.  DO NOT EAT OR DRINK ANYTHING AFTER MIDNIGHT THE NIGHT BEFORE YOUR SURGERY.  YOU MAY BRUSH YOUR TEETH, RINSE OUT YOUR MOUTH--BUT NO WATER, NO FOOD, NO CHEWING GUM, NO MINTS, NO CANDIES, NO CHEWING TOBACCO.  PLEASE TAKE THE FOLLOWING MEDICATIONS THE AM OF YOUR SURGERY WITH A FEW SIPS OF WATER:  FAMOTIDINE, LEVOTHYROXINE   DO NOT BRING VALUABLES, MONEY, CREDIT CARDS.  DO NOT WEAR JEWELRY, MAKE-UP, NAIL POLISH AND NO METAL PINS OR CLIPS IN YOUR HAIR. CONTACT LENS, DENTURES / PARTIALS, GLASSES SHOULD NOT BE WORN TO SURGERY AND IN MOST CASES-HEARING AIDS WILL NEED TO BE REMOVED.  BRING YOUR GLASSES CASE, ANY EQUIPMENT NEEDED FOR YOUR CONTACT LENS. FOR PATIENTS ADMITTED TO THE HOSPITAL--CHECK OUT TIME THE DAY OF DISCHARGE IS 11:00 AM.  ALL INPATIENT ROOMS ARE PRIVATE - WITH BATHROOM, TELEPHONE, TELEVISION AND WIFI INTERNET.  IF YOU ARE BEING DISCHARGED THE SAME DAY OF YOUR SURGERY--YOU CAN NOT DRIVE YOURSELF HOME--AND SHOULD NOT GO HOME ALONE BY TAXI OR BUS.  NO DRIVING OR OPERATING MACHINERY, OR MAKING LEGAL DECISIONS FOR 24 HOURS FOLLOWING ANESTHESIA / PAIN MEDICATIONS.  PLEASE MAKE ARRANGEMENTS FOR SOMEONE TO BE WITH YOU AT HOME THE FIRST 24 HOURS AFTER SURGERY. RESPONSIBLE DRIVER'S NAME / PHONE                                                               CHARLES Alberg  [phone]                                                   PLEASE READ OVER ANY  FACT SHEETS THAT YOU WERE GIVEN: MRSA INFORMATION, BLOOD TRANSFUSION INFORMATION, INCENTIVE SPIROMETER INFORMATION.  PLEASE BE AWARE THAT YOU MAY NEED ADDITIONAL BLOOD DRAWN DAY OF YOUR SURGERY  _______________________________________________________________________   [facility] - Preparing for Surgery Before surgery, you can play an  important role.  Because skin is not sterile, your skin needs to be as free of germs as possible.  You can reduce the number of germs on your skin by washing with CHG (chlorahexidine gluconate) soap before surgery.  CHG is an antiseptic cleaner which kills germs and bonds with the skin to continue killing germs even after washing. Please DO NOT use if you have an allergy to CHG or antibacterial soaps.  If your skin becomes reddened/irritated stop using the CHG and inform your nurse when you arrive at Short Stay. Do not shave (including legs and underarms) for at least 48 hours prior to the first CHG shower.  You may shave your face/neck. Please follow these instructions carefully:  1.  Shower with CHG Soap the night before surgery and the  morning of Surgery.  2.  If you choose to wash your hair, wash your hair first as usual with your  normal  shampoo.  3.  After you shampoo, rinse your hair and body thoroughly  to remove the  shampoo.                           4.  Use CHG as you would any other liquid soap.  You can apply chg directly  to the skin and wash                       Gently with a scrungie or clean washcloth.  5.  Apply the CHG Soap to your body ONLY FROM THE NECK DOWN.   Do not use on face/ open                           Wound or open sores. Avoid contact with eyes, ears mouth and genitals (private parts).                       Wash face,  Genitals (private parts) with your normal soap.             6.  Wash thoroughly, paying special attention to the area where your surgery  will be performed.  7.  Thoroughly rinse your body with warm water from the neck down.  8.  DO NOT shower/wash with your normal soap after using and rinsing off  the CHG Soap.                9.  Pat yourself dry with a clean towel.            10.  Wear clean pajamas.            11.  Place clean sheets on your bed the night of your first shower and do not  sleep with pets. Day of Surgery : Do not apply any  lotions/deodorants the morning of surgery.  Please wear clean clothes to the hospital/surgery center.  FAILURE TO FOLLOW THESE INSTRUCTIONS MAY RESULT IN THE CANCELLATION OF YOUR SURGERY PATIENT SIGNATURE_________________________________  NURSE SIGNATURE__________________________________  ________________________________________________________________________

## 2014-06-19 ENCOUNTER — Ambulatory Visit (HOSPITAL_COMMUNITY)
Admission: RE | Admit: 2014-06-19 | Discharge: 2014-06-19 | Disposition: A | Discharge status: Home / Self Care | Payer: BC Managed Care – PPO | Source: Ambulatory Visit | Attending: General Surgery | Admitting: General Surgery

## 2014-06-19 ENCOUNTER — Ambulatory Visit (HOSPITAL_COMMUNITY): Payer: BC Managed Care – PPO

## 2014-06-19 ENCOUNTER — Encounter (HOSPITAL_COMMUNITY): Payer: BC Managed Care – PPO | Admitting: Anesthesiology

## 2014-06-19 ENCOUNTER — Ambulatory Visit (HOSPITAL_COMMUNITY): Payer: BC Managed Care – PPO | Admitting: Anesthesiology

## 2014-06-19 ENCOUNTER — Encounter (HOSPITAL_COMMUNITY)
Admission: RE | Disposition: A | Discharge status: Home / Self Care | Payer: Self-pay | Source: Ambulatory Visit | Attending: General Surgery

## 2014-06-19 ENCOUNTER — Encounter (HOSPITAL_COMMUNITY): Payer: Self-pay | Admitting: *Deleted

## 2014-06-19 DIAGNOSIS — Z881 Allergy status to other antibiotic agents status: Secondary | ICD-10-CM | POA: Insufficient documentation

## 2014-06-19 DIAGNOSIS — K8010 Calculus of gallbladder with chronic cholecystitis without obstruction: Secondary | ICD-10-CM | POA: Insufficient documentation

## 2014-06-19 DIAGNOSIS — K8020 Calculus of gallbladder without cholecystitis without obstruction: Secondary | ICD-10-CM

## 2014-06-19 DIAGNOSIS — Z79899 Other long term (current) drug therapy: Secondary | ICD-10-CM | POA: Insufficient documentation

## 2014-06-19 DIAGNOSIS — E559 Vitamin D deficiency, unspecified: Secondary | ICD-10-CM | POA: Insufficient documentation

## 2014-06-19 HISTORY — PX: CHOLECYSTECTOMY: SHX55

## 2014-06-19 SURGERY — LAPAROSCOPIC CHOLECYSTECTOMY WITH INTRAOPERATIVE CHOLANGIOGRAM
Anesthesia: General | Site: Abdomen

## 2014-06-19 MED ORDER — OXYCODONE HCL 5 MG PO TABS: 5.0000 mg | ORAL_TABLET | Freq: Once | ORAL | Status: DC | PRN

## 2014-06-19 MED ORDER — HYDROMORPHONE HCL PF 1 MG/ML IJ SOLN
INTRAMUSCULAR | Status: AC
  Filled 2014-06-19: qty 1

## 2014-06-19 MED ORDER — SCOPOLAMINE 1 MG/3DAYS TD PT72
MEDICATED_PATCH | TRANSDERMAL | Status: DC | PRN
  Administered 2014-06-19: 1 via TRANSDERMAL

## 2014-06-19 MED ORDER — ROCURONIUM BROMIDE 100 MG/10ML IV SOLN
INTRAVENOUS | Status: AC
  Filled 2014-06-19: qty 1

## 2014-06-19 MED ORDER — PROMETHAZINE HCL 25 MG/ML IJ SOLN
INTRAMUSCULAR | Status: AC
  Filled 2014-06-19: qty 1

## 2014-06-19 MED ORDER — KETOROLAC TROMETHAMINE 30 MG/ML IJ SOLN
INTRAMUSCULAR | Status: AC
  Filled 2014-06-19: qty 1

## 2014-06-19 MED ORDER — CIPROFLOXACIN IN D5W 400 MG/200ML IV SOLN
INTRAVENOUS | Status: AC
  Filled 2014-06-19: qty 200

## 2014-06-19 MED ORDER — MEPERIDINE HCL 50 MG/ML IJ SOLN: 6.2500 mg | INTRAMUSCULAR | Status: DC | PRN

## 2014-06-19 MED ORDER — IOHEXOL 300 MG/ML  SOLN
INTRAMUSCULAR | Status: DC | PRN
Start: 2014-06-19 — End: 2014-06-19
  Administered 2014-06-19: 1.5 mL

## 2014-06-19 MED ORDER — MIDAZOLAM HCL 2 MG/2ML IJ SOLN
INTRAMUSCULAR | Status: AC
  Filled 2014-06-19: qty 2

## 2014-06-19 MED ORDER — ONDANSETRON HCL 4 MG/2ML IJ SOLN
INTRAMUSCULAR | Status: DC | PRN
  Administered 2014-06-19: 4 mg via INTRAVENOUS

## 2014-06-19 MED ORDER — GLYCOPYRROLATE 0.2 MG/ML IJ SOLN
INTRAMUSCULAR | Status: DC | PRN
  Administered 2014-06-19: 0.6 mg via INTRAVENOUS

## 2014-06-19 MED ORDER — LACTATED RINGERS IR SOLN
Status: DC | PRN
  Administered 2014-06-19: 1000 mL

## 2014-06-19 MED ORDER — LIDOCAINE HCL (CARDIAC) 20 MG/ML IV SOLN
INTRAVENOUS | Status: DC | PRN
  Administered 2014-06-19: 50 mg via INTRAVENOUS

## 2014-06-19 MED ORDER — LACTATED RINGERS IV SOLN
INTRAVENOUS | Status: DC | PRN
  Administered 2014-06-19 (×2): via INTRAVENOUS

## 2014-06-19 MED ORDER — DEXAMETHASONE SODIUM PHOSPHATE 10 MG/ML IJ SOLN
INTRAMUSCULAR | Status: AC
  Filled 2014-06-19: qty 1

## 2014-06-19 MED ORDER — DEXAMETHASONE SODIUM PHOSPHATE 10 MG/ML IJ SOLN
INTRAMUSCULAR | Status: DC | PRN
  Administered 2014-06-19: 10 mg via INTRAVENOUS

## 2014-06-19 MED ORDER — NEOSTIGMINE METHYLSULFATE 10 MG/10ML IV SOLN
INTRAVENOUS | Status: DC | PRN
  Administered 2014-06-19: 4 mg via INTRAVENOUS

## 2014-06-19 MED ORDER — BUPIVACAINE HCL 0.5 % IJ SOLN
INTRAMUSCULAR | Status: DC | PRN
  Administered 2014-06-19: 16 mL

## 2014-06-19 MED ORDER — OXYCODONE HCL 5 MG PO TABS
5.0000 mg | ORAL_TABLET | ORAL | Status: DC | PRN
Start: 2014-06-19 — End: 2016-04-26

## 2014-06-19 MED ORDER — SUCCINYLCHOLINE CHLORIDE 20 MG/ML IJ SOLN
INTRAMUSCULAR | Status: DC | PRN
  Administered 2014-06-19: 100 mg via INTRAVENOUS

## 2014-06-19 MED ORDER — ROCURONIUM BROMIDE 100 MG/10ML IV SOLN
INTRAVENOUS | Status: DC | PRN
  Administered 2014-06-19: 25 mg via INTRAVENOUS
  Administered 2014-06-19 (×2): 5 mg via INTRAVENOUS

## 2014-06-19 MED ORDER — GLYCOPYRROLATE 0.2 MG/ML IJ SOLN
INTRAMUSCULAR | Status: AC
  Filled 2014-06-19: qty 3

## 2014-06-19 MED ORDER — FENTANYL CITRATE 0.05 MG/ML IJ SOLN
INTRAMUSCULAR | Status: AC
  Filled 2014-06-19: qty 5

## 2014-06-19 MED ORDER — ACETAMINOPHEN 10 MG/ML IV SOLN
1000.0000 mg | Freq: Once | INTRAVENOUS | Status: AC
  Administered 2014-06-19: 1000 mg via INTRAVENOUS
  Filled 2014-06-19: qty 100

## 2014-06-19 MED ORDER — BUPIVACAINE HCL (PF) 0.5 % IJ SOLN
INTRAMUSCULAR | Status: AC
  Filled 2014-06-19: qty 30

## 2014-06-19 MED ORDER — SCOPOLAMINE 1 MG/3DAYS TD PT72
MEDICATED_PATCH | TRANSDERMAL | Status: AC
  Filled 2014-06-19: qty 1

## 2014-06-19 MED ORDER — OXYCODONE HCL 5 MG/5ML PO SOLN
5.0000 mg | Freq: Once | ORAL | Status: DC | PRN
  Filled 2014-06-19: qty 5

## 2014-06-19 MED ORDER — PROPOFOL 10 MG/ML IV BOLUS
INTRAVENOUS | Status: AC
  Filled 2014-06-19: qty 20

## 2014-06-19 MED ORDER — PROMETHAZINE HCL 25 MG/ML IJ SOLN
6.2500 mg | INTRAMUSCULAR | Status: DC | PRN
  Administered 2014-06-19: 6.25 mg via INTRAVENOUS

## 2014-06-19 MED ORDER — PROPOFOL 10 MG/ML IV BOLUS
INTRAVENOUS | Status: DC | PRN
  Administered 2014-06-19: 180 mg via INTRAVENOUS

## 2014-06-19 MED ORDER — MIDAZOLAM HCL 5 MG/5ML IJ SOLN
INTRAMUSCULAR | Status: DC | PRN
  Administered 2014-06-19: 2 mg via INTRAVENOUS

## 2014-06-19 MED ORDER — LIDOCAINE HCL (CARDIAC) 20 MG/ML IV SOLN
INTRAVENOUS | Status: AC
  Filled 2014-06-19: qty 5

## 2014-06-19 MED ORDER — KETOROLAC TROMETHAMINE 30 MG/ML IJ SOLN
30.0000 mg | Freq: Once | INTRAMUSCULAR | Status: AC
  Administered 2014-06-19: 30 mg via INTRAVENOUS

## 2014-06-19 MED ORDER — ONDANSETRON HCL 4 MG/2ML IJ SOLN
INTRAMUSCULAR | Status: AC
  Filled 2014-06-19: qty 2

## 2014-06-19 MED ORDER — HYDROMORPHONE HCL PF 1 MG/ML IJ SOLN
0.2500 mg | INTRAMUSCULAR | Status: DC | PRN
  Administered 2014-06-19 (×4): 0.5 mg via INTRAVENOUS

## 2014-06-19 MED ORDER — 0.9 % SODIUM CHLORIDE (POUR BTL) OPTIME
TOPICAL | Status: DC | PRN
  Administered 2014-06-19: 1000 mL

## 2014-06-19 MED ORDER — NEOSTIGMINE METHYLSULFATE 10 MG/10ML IV SOLN
INTRAVENOUS | Status: AC
  Filled 2014-06-19: qty 1

## 2014-06-19 MED ORDER — CIPROFLOXACIN IN D5W 400 MG/200ML IV SOLN
400.0000 mg | INTRAVENOUS | Status: AC
  Administered 2014-06-19: 400 mg via INTRAVENOUS

## 2014-06-19 MED ORDER — FENTANYL CITRATE 0.05 MG/ML IJ SOLN
INTRAMUSCULAR | Status: DC | PRN
  Administered 2014-06-19: 50 ug via INTRAVENOUS
  Administered 2014-06-19: 100 ug via INTRAVENOUS
  Administered 2014-06-19 (×2): 50 ug via INTRAVENOUS

## 2014-06-19 SURGICAL SUPPLY — 46 items
APL SKNCLS STERI-STRIP NONHPOA (GAUZE/BANDAGES/DRESSINGS) ×1
APPLIER CLIP 5 13 M/L LIGAMAX5 (MISCELLANEOUS) ×3
APR CLP MED LRG 5 ANG JAW (MISCELLANEOUS) ×1
BAG SPEC RTRVL LRG 6X4 10 (ENDOMECHANICALS) ×1
BENZOIN TINCTURE PRP APPL 2/3 (GAUZE/BANDAGES/DRESSINGS) ×3 IMPLANT
CHLORAPREP W/TINT 26ML (MISCELLANEOUS) ×3 IMPLANT
CLIP APPLIE 5 13 M/L LIGAMAX5 (MISCELLANEOUS) ×1 IMPLANT
CLOSURE WOUND 1/2 X4 (GAUZE/BANDAGES/DRESSINGS) ×1
COVER MAYO STAND STRL (DRAPES) ×3 IMPLANT
DECANTER SPIKE VIAL GLASS SM (MISCELLANEOUS) ×1 IMPLANT
DISSECTOR BLUNT TIP ENDO 5MM (MISCELLANEOUS) IMPLANT
DRAPE C-ARM 42X120 X-RAY (DRAPES) ×3 IMPLANT
DRAPE LAPAROSCOPIC ABDOMINAL (DRAPES) ×3 IMPLANT
DRAPE UTILITY XL STRL (DRAPES) ×3 IMPLANT
DRSG TEGADERM 2-3/8X2-3/4 SM (GAUZE/BANDAGES/DRESSINGS) ×5 IMPLANT
ELECT REM PT RETURN 9FT ADLT (ELECTROSURGICAL) ×3
ELECTRODE REM PT RTRN 9FT ADLT (ELECTROSURGICAL) ×1 IMPLANT
ENDOLOOP SUT PDS II  0 18 (SUTURE)
ENDOLOOP SUT PDS II 0 18 (SUTURE) IMPLANT
GAUZE SPONGE 2X2 8PLY STRL LF (GAUZE/BANDAGES/DRESSINGS) ×1 IMPLANT
GLOVE BIOGEL PI IND STRL 7.0 (GLOVE) IMPLANT
GLOVE BIOGEL PI INDICATOR 7.0 (GLOVE) ×4
GLOVE ECLIPSE 7.0 STRL STRAW (GLOVE) ×2 IMPLANT
GLOVE ECLIPSE 8.0 STRL XLNG CF (GLOVE) ×3 IMPLANT
GLOVE EUDERMIC 7 POWDERFREE (GLOVE) ×2 IMPLANT
GLOVE INDICATOR 8.0 STRL GRN (GLOVE) ×3 IMPLANT
GLOVE SURG SS PI 6.5 STRL IVOR (GLOVE) ×2 IMPLANT
GOWN STRL REUS W/TWL XL LVL3 (GOWN DISPOSABLE) ×11 IMPLANT
HEMOSTAT SNOW SURGICEL 2X4 (HEMOSTASIS) ×2 IMPLANT
KIT BASIN OR (CUSTOM PROCEDURE TRAY) ×3 IMPLANT
POUCH SPECIMEN RETRIEVAL 10MM (ENDOMECHANICALS) ×3 IMPLANT
SCISSORS LAP 5X35 DISP (ENDOMECHANICALS) ×3 IMPLANT
SET CHOLANGIOGRAPH MIX (MISCELLANEOUS) ×3 IMPLANT
SET IRRIG TUBING LAPAROSCOPIC (IRRIGATION / IRRIGATOR) ×3 IMPLANT
SLEEVE XCEL OPT CAN 5 100 (ENDOMECHANICALS) ×6 IMPLANT
SOLUTION ANTI FOG 6CC (MISCELLANEOUS) ×3 IMPLANT
SPONGE GAUZE 2X2 STER 10/PKG (GAUZE/BANDAGES/DRESSINGS) ×2
STRIP CLOSURE SKIN 1/2X4 (GAUZE/BANDAGES/DRESSINGS) ×2 IMPLANT
SUT MNCRL AB 4-0 PS2 18 (SUTURE) ×3 IMPLANT
TOWEL OR 17X26 10 PK STRL BLUE (TOWEL DISPOSABLE) ×3 IMPLANT
TOWEL OR NON WOVEN STRL DISP B (DISPOSABLE) ×3 IMPLANT
TRAY LAP CHOLE (CUSTOM PROCEDURE TRAY) ×3 IMPLANT
TROCAR BLADELESS OPT 5 100 (ENDOMECHANICALS) ×3 IMPLANT
TROCAR XCEL BLUNT TIP 100MML (ENDOMECHANICALS) ×3 IMPLANT
TROCAR XCEL NON-BLD 11X100MML (ENDOMECHANICALS) IMPLANT
TUBING INSUFFLATION 10FT LAP (TUBING) ×3 IMPLANT

## 2014-06-19 NOTE — Transfer of Care (Signed)
Immediate Anesthesia Transfer of Care Note  Patient: Bonnie Schwartz  Procedure(s) Performed: Procedure(s): LAPAROSCOPIC CHOLECYSTECTOMY WITH INTRAOPERATIVE CHOLANGIOGRAM (N/A)  Patient Location: PACU  Anesthesia Type:General  Level of Consciousness: awake, alert  and oriented  Airway & Oxygen Therapy: Patient Spontanous Breathing and Patient connected to face mask oxygen  Post-op Assessment: Report given to PACU RN and Post -op Vital signs reviewed and stable  Post vital signs: Reviewed and stable  Complications: No apparent anesthesia complications

## 2014-06-19 NOTE — Anesthesia Postprocedure Evaluation (Signed)
Anesthesia Post Note  Patient: Bonnie Schwartz  Procedure(s) Performed: Procedure(s) (LRB): LAPAROSCOPIC CHOLECYSTECTOMY WITH INTRAOPERATIVE CHOLANGIOGRAM (N/A)  Anesthesia type: General  Patient location: PACU  Post pain: Pain level controlled  Post assessment: Post-op Vital signs reviewed  Last Vitals: BP 124/69  Pulse 54  Temp(Src) 36.6 C (Oral)  Resp 16  Ht  (1.651 m)  Wt 214 lb (97.07 kg)  BMI 35.61 kg/m2  SpO2 96%  Post vital signs: Reviewed  Level of consciousness: sedated  Complications: No apparent anesthesia complications

## 2014-06-19 NOTE — Discharge Instructions (Signed)
[facility] ______[facility] LAPAROSCOPIC SURGERY: POST OP INSTRUCTIONS Always review your discharge instruction sheet given to you by the facility where your surgery was performed. IF YOU HAVE DISABILITY OR FAMILY LEAVE FORMS, YOU MUST BRING THEM TO THE OFFICE FOR PROCESSING.   DO NOT GIVE THEM TO YOUR DOCTOR.  1. A prescription for pain medication may be given to you upon discharge.  Take your pain medication as prescribed, if needed.  If narcotic pain medicine is not needed, then you may take acetaminophen (Tylenol) or ibuprofen (Advil) as needed. 2. Take your usually prescribed medications unless otherwise directed. 3. If you need a refill on your pain medication, please contact your pharmacy.  They will contact our office to request authorization. Prescriptions will not be filled after 5pm or on week-ends. 4. You should follow a light diet the first few days after arrival home, such as soup and crackers, etc.  Be sure to include lots of fluids daily. 5. Most patients will experience some swelling and bruising in the area of the incisions.  Ice packs will help.  Swelling and bruising can take several days to resolve.  6. It is common to experience some constipation if taking pain medication after surgery.  Increasing fluid intake and taking a stool softener (such as Colace) will usually help or prevent this problem from occurring.  A mild laxative (Milk of Magnesia or Miralax) should be taken according to package instructions if there are no bowel movements after 48 hours. 7. Unless discharge instructions indicate otherwise, you may remove your bandages 72 hours after surgery, and you may shower at that time.  You may have steri-strips (small skin tapes) in place directly over the incision.  These strips should be left on the skin.  If your surgeon used skin glue on the incision, you may shower in 24 hours.  The glue will flake off over the next 2-3 weeks.  Any sutures or staples will be  removed at the office during your follow-up visit. 8. ACTIVITIES:  You may resume regular (light) daily activities beginning the next day--such as daily self-care, walking, climbing stairs--gradually increasing activities as tolerated.  You may have sexual intercourse when it is comfortable.  Refrain from any heavy lifting or straining-nothing over 10 pounds for 2 weeks.  a. You may drive when you are no longer taking prescription pain medication, you can comfortably wear a seatbelt, and you can safely maneuver your car and apply brakes. b. RETURN TO WORK: Desk work only after 5-7 days, full duty in 2 weeks as long as you are comfortable. __________________________________________________________ 9. You should see your doctor in the office for a follow-up appointment approximately 2-3 weeks after your surgery.  Make sure that you call for this appointment within a day or two after you arrive home to insure a convenient appointment time. 10. OTHER INSTRUCTIONS: __________________________________________________________________________________________________________________________ __________________________________________________________________________________________________________________________ WHEN TO CALL YOUR DOCTOR: 1. Fever over 101.0 2. Inability to urinate 3. Continued bleeding from incision. 4. Increased pain, redness, or drainage from the incision. 5. Increasing abdominal pain  The clinic staff is available to answer your questions during regular business hours.  Please dont hesitate to call and ask to speak to one of the nurses for clinical concerns.  If you have a medical emergency, go to the nearest emergency room or call 911.  A surgeon from [facility] is always on call at the hospital. [address], [place], [place]  [address] ? [id number],  [place], [place]   [address] [phone] ? [phone] ? FAX [phone] Web site:  [email or web address]

## 2014-06-19 NOTE — Anesthesia Preprocedure Evaluation (Signed)
Anesthesia Evaluation  Patient identified by MRN, date of birth, ID band Patient awake    Reviewed: Allergy & Precautions, H&P , NPO status , Patient's Chart, lab work & pertinent test results  Airway Mallampati: II TM Distance: >3 FB Neck ROM: Full    Dental no notable dental hx.    Pulmonary neg pulmonary ROS,  breath sounds clear to auscultation  Pulmonary exam normal       Cardiovascular negative cardio ROS  Rhythm:Regular Rate:Normal     Neuro/Psych  Headaches, negative psych ROS   GI/Hepatic Neg liver ROS, GERD-  ,  Endo/Other  Hypothyroidism   Renal/GU negative Renal ROS     Musculoskeletal negative musculoskeletal ROS (+)   Abdominal   Peds  Hematology negative hematology ROS (+)   Anesthesia Other Findings   Reproductive/Obstetrics negative OB ROS                           Anesthesia Physical Anesthesia Plan  ASA: II  Anesthesia Plan: General   Post-op Pain Management:    Induction: Intravenous  Airway Management Planned: Oral ETT  Additional Equipment:   Intra-op Plan:   Post-operative Plan: Extubation in OR  Informed Consent: I have reviewed the patients History and Physical, chart, labs and discussed the procedure including the risks, benefits and alternatives for the proposed anesthesia with the patient or authorized representative who has indicated his/her understanding and acceptance.   Dental advisory given  Plan Discussed with: CRNA  Anesthesia Plan Comments:         Anesthesia Quick Evaluation

## 2014-06-19 NOTE — Interval H&P Note (Signed)
History and Physical Interval Note:  06/19/2014 9:28 AM  Bonnie Schwartz  has presented today for surgery, with the diagnosis of symptomatic cholelthiasis  The various methods of treatment have been discussed with the patient and family. After consideration of risks, benefits and other options for treatment, the patient has consented to  Procedure(s): LAPAROSCOPIC CHOLECYSTECTOMY WITH INTRAOPERATIVE CHOLANGIOGRAM (N/A) as a surgical intervention .  The patient's history has been reviewed, patient examined, no change in status, stable for surgery.  I have reviewed the patient's chart and labs.  Questions were answered to the patient's satisfaction.     Ej Pinson [facility]

## 2014-06-19 NOTE — H&P (View-Only) (Signed)
Patient ID: Bonnie Schwartz, female   DOB: 09-Oct-1961, 53 y.o.   MRN: [id number]  Chief Complaint  Patient presents with  . Cholelithiasis    HPI Bonnie Schwartz is a 53 y.o. female.   HPI  She is referred by Dr. Nicholos Johns because of right upper quadrant pain and cholelithiasis.  She's had intermittent right upper quadrant pains for many years. However she's had 2 significant episodes of severe right upper quadrant pain associated with some nausea and vomiting after using a fatty meal. She now has intermittent episodes and had been last Sunday. He is tender last 3 hours. No fever or chills. Ultrasound demonstrated a large gallstone measuring 1.9 cm but no evidence of acute cholecystitis by ultrasound criteria. Changes consistent with fatty infiltration of the liver were noted.  Common bile duct diameter was normal. Her father had his gallbladder removed.  Past Medical History  Diagnosis Date  . Hormone disorder   . Vitamin B12 deficiency   . Back pain   . Herpes     Past Surgical History  Procedure Laterality Date  . Pelvic floor surgery      X2  . Av fistula repair    . Endometrial ablation      Family History  Problem Relation Age of Onset  . Cancer Mother 58    UTERINE  . Diabetes Mother   . Hypertension Mother   . Breast cancer Maternal Grandmother   . Cancer Maternal Grandmother 74    OVARIAN    Social History History  Substance Use Topics  . Smoking status: Never Smoker   . Smokeless tobacco: Never Used  . Alcohol Use: No    Allergies  Allergen Reactions  . Erythromycin   . Minocycline     Current Outpatient Prescriptions  Medication Sig Dispense Refill  . Cyanocobalamin (VITAMIN B 12 PO) Take by mouth.      . levothyroxine (SYNTHROID, LEVOTHROID) 88 MCG tablet Take 88 mcg by mouth daily before breakfast.      . LORazepam (ATIVAN) 1 MG tablet Take 1 mg by mouth every 8 (eight) hours.      . valACYclovir (VALTREX) 1000 MG tablet Take 1,000 mg by mouth as  needed.      . ALPRAZolam (XANAX) 0.5 MG tablet Take 0.5 mg by mouth as needed for sleep.      . phentermine 37.5 MG capsule Take 37.5 mg by mouth every morning.       No current facility-administered medications for this visit.    Review of Systems Review of Systems  Constitutional: Negative for fever and chills.       Weight gain  Respiratory: Negative.   Cardiovascular: Negative.   Gastrointestinal: Positive for nausea, vomiting and abdominal pain.    Blood pressure 124/68, pulse 68, temperature 98.2 F (36.8 C), temperature source Oral, height  (1.651 m), weight 213 lb 6.4 oz (96.798 kg).  Physical Exam Physical Exam  Constitutional: No distress.  Obese female  HENT:  Head: Normocephalic and atraumatic.  Cardiovascular: Normal rate and regular rhythm.   Pulmonary/Chest: Effort normal and breath sounds normal.  Abdominal: Soft. She exhibits no mass. There is no tenderness.  Musculoskeletal: She exhibits no edema.  Neurological: She is alert.  Skin: Skin is warm and dry.    Data Reviewed Ultrasound report  Assessment    Symptomatic cholelithiasis. Symptoms are better if she is careful with her diet.     Plan    We discussed a laparoscopic  possible open cholecystectomy and she is interested in proceeding with this.I have explained the procedure, risks, and aftercare of cholecystectomy.  Risks include but are not limited to bleeding, infection, wound problems, anesthesia, diarrhea, bile leak, injury to common bile duct/liver/intestine.  She seems to understand and agrees to proceed.         Dontea Corlew J 05/21/2014, 3:34 PM

## 2014-06-19 NOTE — Op Note (Signed)
Preoperative diagnosis:  Symptomatic cholelithiasis  Postoperative diagnosis:  Same  Procedure: Laparoscopic cholecystectomy with cholangiogram.  Surgeon: Avel Peace, M.D.  Asst.:  Claud Kelp, M.D.  Anesthesia: General  Indication:   This is a 53 year old female with symptomatic cholelithiasis. She has a large gallstone in the gallbladder. There is some suggestion of fatty infiltration of the liver as well. She now presents for elective cholecystectomy.  Technique: She was brought to the operating room, placed supine on the operating table, and a general anesthetic was administered. The hair on the abdominal wall was clipped as was necessary. The abdominal wall was then sterilely prepped and draped. Local anesthetic (Marcaine) was infiltrated in the subumbilical region. A small subumbilical incision was made through the skin, subcutaneous tissue, fascia, and peritoneum entering the peritoneal cavity under direct vision. A pursestring suture of 0 Vicryl was placed around the edges of the fascia. A Hassan trocar was introduced into the peritoneal cavity and a pneumoperitoneum was created by insufflation of carbon dioxide gas. The laparoscope was introduced into the trocar and no underlying bleeding or organ injury was noted. She was then placed in the reverse Trendelenburg position with the right side tilted slightly up.  Three 5 mm trocars were then placed into the abdominal cavity under laparoscopic vision. One in the epigastric area, and 2 in the right upper quadrant area. The gallbladder was visualized and the fundus was grasped and retracted toward the right shoulder. No acute inflammatory changes were noted. The infundibulum was mobilized with dissection close to the gallbladder and retracted laterally. The cystic duct was identified and a window was created around it. The anterior branch  cystic artery was also identified and a window was created around it.  It was clipped and divided.  The critical view was achieved. A clip was placed at the neck of the gallbladder. A small incision was made in the cystic duct. A cholangiocatheter was introduced through the anterior abdominal wall and placed in the cystic duct. A intraoperative cholangiogram was then performed.  Under real-time fluoroscopy, dilute contrast was injected into the cystic duct.  The common hepatic duct, the right and left hepatic ducts, and the common duct were all visualized. Contrast drained into the duodenum without obvious evidence of any obstructing ductal lesion. The final report is pending the Radiologist's interpretation.  The cholangiocatheter was removed, the cystic duct was clipped 3 times on the biliary side, and then the cystic duct was divided sharply. No bile leak was noted from the cystic duct stump.  The posterior branch of the cystic artery was identified close to the gallbladder. It was clipped and divided. Following this the gallbladder was dissected free from the liver using electrocautery. The gallbladder was then placed in a retrieval bag and removed from the abdominal cavity through the subumbilical incision. There was minimal bile leakage the gallbladder.  The gallbladder fossa was inspected, irrigated, and bleeding was controlled with electrocautery. Inspection showed that hemostasis was adequate and there was no evidence of bile leak.  The irrigation fluid was evacuated as much as possible.  The subumbilical trocar was removed and the fascial defect was closed by tightening and tying down the pursestring suture under laparoscopic vision.  The remaining trocars were removed and the pneumoperitoneum was released. The skin incisions were closed with 4-0 Monocryl subcuticular stitches. Steri-Strips and sterile dressings were applied.  The procedure was well-tolerated without any apparent complications. She was taken to the recovery room in satisfactory condition.

## 2014-06-22 ENCOUNTER — Encounter (HOSPITAL_COMMUNITY): Payer: Self-pay | Admitting: General Surgery

## 2014-06-30 ENCOUNTER — Other Ambulatory Visit: Payer: Self-pay

## 2014-06-30 DIAGNOSIS — Z1231 Encounter for screening mammogram for malignant neoplasm of breast: Secondary | ICD-10-CM

## 2014-07-08 ENCOUNTER — Encounter (INDEPENDENT_AMBULATORY_CARE_PROVIDER_SITE_OTHER): Payer: BC Managed Care – PPO | Admitting: General Surgery

## 2014-07-24 ENCOUNTER — Other Ambulatory Visit: Payer: Self-pay

## 2014-07-29 ENCOUNTER — Ambulatory Visit
Admission: RE | Admit: 2014-07-29 | Discharge: 2014-07-29 | Disposition: A | Discharge status: Home / Self Care | Payer: BC Managed Care – PPO | Source: Ambulatory Visit

## 2014-07-29 DIAGNOSIS — Z1231 Encounter for screening mammogram for malignant neoplasm of breast: Secondary | ICD-10-CM

## 2014-08-10 ENCOUNTER — Encounter (HOSPITAL_COMMUNITY): Payer: Self-pay | Admitting: General Surgery

## 2015-07-01 ENCOUNTER — Other Ambulatory Visit: Payer: Self-pay

## 2015-07-01 DIAGNOSIS — Z1231 Encounter for screening mammogram for malignant neoplasm of breast: Secondary | ICD-10-CM

## 2015-08-05 ENCOUNTER — Ambulatory Visit
Admission: RE | Admit: 2015-08-05 | Discharge: 2015-08-05 | Disposition: A | Discharge status: Home / Self Care | Payer: BC Managed Care – PPO | Source: Ambulatory Visit

## 2015-08-05 DIAGNOSIS — Z1231 Encounter for screening mammogram for malignant neoplasm of breast: Secondary | ICD-10-CM

## 2015-10-10 HISTORY — PX: OTHER SURGICAL HISTORY: SHX169

## 2015-10-21 ENCOUNTER — Other Ambulatory Visit: Payer: Self-pay | Admitting: Ophthalmology

## 2016-02-10 ENCOUNTER — Other Ambulatory Visit (HOSPITAL_COMMUNITY): Payer: Self-pay | Admitting: General Surgery

## 2016-02-21 ENCOUNTER — Ambulatory Visit (HOSPITAL_COMMUNITY)
Admission: RE | Admit: 2016-02-21 | Discharge: 2016-02-21 | Disposition: A | Discharge status: Home / Self Care | Payer: BC Managed Care – PPO | Source: Ambulatory Visit | Attending: General Surgery | Admitting: General Surgery

## 2016-02-21 ENCOUNTER — Other Ambulatory Visit: Payer: Self-pay

## 2016-02-21 DIAGNOSIS — K449 Diaphragmatic hernia without obstruction or gangrene: Secondary | ICD-10-CM | POA: Diagnosis not present

## 2016-02-24 ENCOUNTER — Encounter: Payer: BC Managed Care – PPO | Attending: General Surgery | Admitting: Dietician

## 2016-02-24 ENCOUNTER — Encounter: Payer: Self-pay | Admitting: Dietician

## 2016-02-24 DIAGNOSIS — Z6822 Body mass index (BMI) 22.0-22.9, adult: Secondary | ICD-10-CM | POA: Insufficient documentation

## 2016-02-24 DIAGNOSIS — Z713 Dietary counseling and surveillance: Secondary | ICD-10-CM | POA: Diagnosis present

## 2016-02-24 NOTE — Progress Notes (Signed)
  Pre-Op Assessment Visit:  Pre-Operative Sleeve Gaastrectomy Surgery  Medical Nutrition Therapy:  Appt start time: 200   End time:  245  Patient was seen on 02/24/2016 for Pre-Operative Nutrition Assessment. Assessment and [facility] on 02/24/2016.   Preferred Learning Style:   No preference indicated   Learning Readiness:   Ready  Handouts given during visit include:  Pre-Op Goals Bariatric Surgery Protein Shakes   During the appointment today the following Pre-Op Goals were reviewed with the patient: Maintain or lose weight as instructed by your surgeon Make healthy food choices Begin to limit portion sizes Limited concentrated sugars and fried foods Keep fat/sugar in the single digits per serving on   food labels Practice CHEWING your food  (aim for 30 chews per bite or until applesauce consistency) Practice not drinking 15 minutes before, during, and 30 minutes after each meal/snack Avoid all carbonated beverages  Avoid/limit caffeinated beverages  Avoid all sugar-sweetened beverages Consume 3 meals per day; eat every 3-5 hours Make a list of non-food related activities Aim for 64-100 ounces of FLUID daily  Aim for at least 60-80 grams of PROTEIN daily Look for a liquid protein source that contain ?15 g protein and ?5 g carbohydrate  (ex: shakes, drinks, shots)  Demonstrated degree of understanding via:  Teach Back  Teaching Method Utilized:  Visual Auditory Hands on  Barriers to learning/adherence to lifestyle change: none  Patient to call the [facility] to enroll in Pre-Op and Post-Op Nutrition Education when surgery date is scheduled.

## 2016-02-29 ENCOUNTER — Ambulatory Visit (INDEPENDENT_AMBULATORY_CARE_PROVIDER_SITE_OTHER): Payer: BC Managed Care – PPO | Admitting: Psychiatry

## 2016-03-14 ENCOUNTER — Ambulatory Visit (INDEPENDENT_AMBULATORY_CARE_PROVIDER_SITE_OTHER): Payer: BC Managed Care – PPO | Admitting: Psychiatry

## 2016-04-03 ENCOUNTER — Encounter: Payer: BC Managed Care – PPO | Attending: General Surgery

## 2016-04-03 DIAGNOSIS — Z713 Dietary counseling and surveillance: Secondary | ICD-10-CM | POA: Diagnosis not present

## 2016-04-03 DIAGNOSIS — Z6841 Body Mass Index (BMI) 40.0 and over, adult: Secondary | ICD-10-CM | POA: Insufficient documentation

## 2016-04-03 NOTE — Progress Notes (Signed)
  Pre-Operative Nutrition Class:  Appt start time: 2703   End time:  1830.  Patient was seen on 04/03/16 for Pre-Operative Bariatric Surgery Education at the [facility].   Surgery date: 04/25/2016 Surgery type: sleeve gastrectomy Start weight at [facility]: 250 lbs on 02/24/2016 Weight today: 252.2 lbs  TANITA  BODY COMP RESULTS  04/03/16   BMI (kg/m^2) 42   Fat Mass (lbs) 133.6   Fat Free Mass (lbs) 118.6   Total Body Water (lbs) 87   Samples given per MNT protocol. Patient educated on appropriate usage: Bariatric Advantage Multivitamin chewable (mixed fruit - qty 1) Lot #: [id number] Exp: 04/2017  Bariatric Advantage Calcium Citrate chew (orange - qty 1) Lot #: [id number] Exp: 05/2016  Premier protein shake (strawberry - qty 1) Lot #: [id number] Exp: 02/2017  Renee Pain Protein Powder (chocolate - qty 1) Lot #: [id number] Exp: 08/2017  The following the learning objectives were met by the patient during this course:  Identify Pre-Op Dietary Goals and will begin 2 weeks pre-operatively  Identify appropriate sources of fluids and proteins   State protein recommendations and appropriate sources pre and post-operatively  Identify Post-Operative Dietary Goals and will follow for 2 weeks post-operatively  Identify appropriate multivitamin and calcium sources  Describe the need for physical activity post-operatively and will follow MD recommendations  State when to call healthcare provider regarding medication questions or post-operative complications  Handouts given during class include:  Pre-Op Bariatric Surgery Diet Handout  Protein Shake Handout  Post-Op Bariatric Surgery Nutrition Handout  BELT Program Information Flyer  Support Group Information Flyer  [facility] Bariatric Supplements Price List  Follow-Up Plan: Patient will follow-up at [facility] 2 weeks post operatively for diet advancement per MD.

## 2016-04-14 ENCOUNTER — Other Ambulatory Visit: Payer: Self-pay | Admitting: General Surgery

## 2016-04-19 NOTE — Patient Instructions (Addendum)
Bonnie Schwartz  04/19/2016   Your procedure is scheduled on: Tuesday 04/25/2016  Report to [facility] Main  Entrance take [place]  elevators to 3rd floor to  Short Stay Center at   0515  AM.  Call this number if you have problems the morning of surgery [phone]   Remember: ONLY 1 PERSON MAY GO WITH YOU TO SHORT STAY TO GET  READY MORNING OF YOUR SURGERY.   Do not eat food or drink liquids :After Midnight.     Take these medicines the morning of surgery with A SIP OF WATER:  LEVOTHYROXINE, BUPROPION (Zyban), Fluticasone nasal spay                                 You may not have any metal on your body including hair pins and              piercings  Do not wear jewelry, make-up, lotions, powders or perfumes, deodorant             Do not wear nail polish.  Do not shave  48 hours prior to surgery.              Men may shave face and neck.   Do not bring valuables to the hospital. [facility] IS NOT             RESPONSIBLE   FOR VALUABLES.  Contacts, dentures or bridgework may not be worn into surgery.  Leave suitcase in the car. After surgery it may be brought to your room.                  Please read over the following fact sheets you were given: _____________________________________________________________________ [facility] - Preparing for Surgery Before surgery, you can play an important role.  Because skin is not sterile, your skin needs to be as free of germs as possible.  You can reduce the number of germs on your skin by washing with CHG (chlorahexidine gluconate) soap before surgery.  CHG is an antiseptic cleaner which kills germs and bonds with the skin to continue killing germs even after washing. Please DO NOT use if you have an allergy to CHG or antibacterial soaps.  If your skin becomes reddened/irritated stop using the CHG and inform your nurse when you arrive at Short Stay. Do not shave (including legs and underarms) for at least 48 hours  prior to the first CHG shower.  You may shave your face/neck. Please follow these instructions carefully:  1.  Shower with CHG Soap the night before surgery and the  morning of Surgery.  2.  If you choose to wash your hair, wash your hair first as usual with your  normal  shampoo.  3.  After you shampoo, rinse your hair and body thoroughly to remove the  shampoo.                           4.  Use CHG as you would any other liquid soap.  You can apply chg directly  to the skin and wash                       Gently with a scrungie or clean washcloth.  5.  Apply the CHG Soap  to your body ONLY FROM THE NECK DOWN.   Do not use on face/ open                           Wound or open sores. Avoid contact with eyes, ears mouth and genitals (private parts).                       Wash face,  Genitals (private parts) with your normal soap.             6.  Wash thoroughly, paying special attention to the area where your surgery  will be performed.  7.  Thoroughly rinse your body with warm water from the neck down.  8.  DO NOT shower/wash with your normal soap after using and rinsing off  the CHG Soap.                9.  Pat yourself dry with a clean towel.            10.  Wear clean pajamas.            11.  Place clean sheets on your bed the night of your first shower and do not  sleep with pets. Day of Surgery : Do not apply any lotions/deodorants the morning of surgery.  Please wear clean clothes to the hospital/surgery center.  FAILURE TO FOLLOW THESE INSTRUCTIONS MAY RESULT IN THE CANCELLATION OF YOUR SURGERY PATIENT SIGNATURE_________________________________  NURSE SIGNATURE__________________________________  ________________________________________________________________________               Bonnie MireIncentive Spirometer  An incentive spirometer is a tool that can help keep your lungs clear and active. This tool measures how well you are filling your lungs with each breath. Taking long deep breaths  may help reverse or decrease the chance of developing breathing (pulmonary) problems (especially infection) following:  A long period of time when you are unable to move or be active. BEFORE THE PROCEDURE   If the spirometer includes an indicator to show your best effort, your nurse or respiratory therapist will set it to a desired goal.  If possible, sit up straight or lean slightly forward. Try not to slouch.  Hold the incentive spirometer in an upright position. INSTRUCTIONS FOR USE  1. Sit on the edge of your bed if possible, or sit up as far as you can in bed or on a chair. 2. Hold the incentive spirometer in an upright position. 3. Breathe out normally. 4. Place the mouthpiece in your mouth and seal your lips tightly around it. 5. Breathe in slowly and as deeply as possible, raising the piston or the ball toward the top of the column. 6. Hold your breath for 3-5 seconds or for as long as possible. Allow the piston or ball to fall to the bottom of the column. 7. Remove the mouthpiece from your mouth and breathe out normally. 8. Rest for a few seconds and repeat Steps 1 through 7 at least 10 times every 1-2 hours when you are awake. Take your time and take a few normal breaths between deep breaths. 9. The spirometer may include an indicator to show your best effort. Use the indicator as a goal to work toward during each repetition. 10. After each set of 10 deep breaths, practice coughing to be sure your lungs are clear. If you have an incision (the cut made at the time of surgery), support your  incision when coughing by placing a pillow or rolled up towels firmly against it. Once you are able to get out of bed, walk around indoors and cough well. You may stop using the incentive spirometer when instructed by your caregiver.  RISKS AND COMPLICATIONS  Take your time so you do not get dizzy or light-headed.  If you are in pain, you may need to take or ask for pain medication before doing  incentive spirometry. It is harder to take a deep breath if you are having pain. AFTER USE  Rest and breathe slowly and easily.  It can be helpful to keep track of a log of your progress. Your caregiver can provide you with a simple table to help with this. If you are using the spirometer at home, follow these instructions: SEEK MEDICAL CARE IF:   You are having difficultly using the spirometer.  You have trouble using the spirometer as often as instructed.  Your pain medication is not giving enough relief while using the spirometer.  You develop fever of 100.5 F (38.1 C) or higher. SEEK IMMEDIATE MEDICAL CARE IF:   You cough up bloody sputum that had not been present before.  You develop fever of 102 F (38.9 C) or greater.  You develop worsening pain at or near the incision site. MAKE SURE YOU:   Understand these instructions.  Will watch your condition.  Will get help right away if you are not doing well or get worse. Document Released: 02/05/2007 Document Revised: 12/18/2011 Document Reviewed: 04/08/2007 [facility] Patient Information 2014 [place], [place].   ________________________________________________________________________

## 2016-04-20 ENCOUNTER — Encounter (HOSPITAL_COMMUNITY): Payer: Self-pay

## 2016-04-20 ENCOUNTER — Encounter (HOSPITAL_COMMUNITY)
Admission: RE | Admit: 2016-04-20 | Discharge: 2016-04-20 | Disposition: A | Discharge status: Home / Self Care | Payer: BC Managed Care – PPO | Source: Ambulatory Visit | Attending: General Surgery | Admitting: General Surgery

## 2016-04-20 DIAGNOSIS — Z01810 Encounter for preprocedural cardiovascular examination: Secondary | ICD-10-CM | POA: Diagnosis present

## 2016-04-20 DIAGNOSIS — Z01812 Encounter for preprocedural laboratory examination: Secondary | ICD-10-CM | POA: Insufficient documentation

## 2016-04-20 HISTORY — DX: Adverse effect of unspecified anesthetic, initial encounter: T41.45XA

## 2016-04-20 HISTORY — DX: Other specified postprocedural states: Z98.890

## 2016-04-20 HISTORY — DX: Other complications of anesthesia, initial encounter: T88.59XA

## 2016-04-20 HISTORY — DX: Other specified postprocedural states: R11.2

## 2016-04-20 LAB — CBC WITH DIFFERENTIAL/PLATELET
BASOS PCT: 0 %
Basophils Absolute: 0 10*3/uL (ref 0.0–0.1)
EOS ABS: 0.1 10*3/uL (ref 0.0–0.7)
Eosinophils Relative: 1 %
HEMATOCRIT: 43.1 % (ref 36.0–46.0)
HEMOGLOBIN: 14.2 g/dL (ref 12.0–15.0)
LYMPHS ABS: 1.9 10*3/uL (ref 0.7–4.0)
Lymphocytes Relative: 27 %
MCH: 28.3 pg (ref 26.0–34.0)
MCHC: 32.9 g/dL (ref 30.0–36.0)
MCV: 86 fL (ref 78.0–100.0)
MONOS PCT: 9 %
Monocytes Absolute: 0.6 10*3/uL (ref 0.1–1.0)
NEUTROS ABS: 4.6 10*3/uL (ref 1.7–7.7)
NEUTROS PCT: 63 %
Platelets: 322 10*3/uL (ref 150–400)
RBC: 5.01 MIL/uL (ref 3.87–5.11)
RDW: 15.2 % (ref 11.5–15.5)
WBC: 7.2 10*3/uL (ref 4.0–10.5)

## 2016-04-20 LAB — COMPREHENSIVE METABOLIC PANEL
ALBUMIN: 4.6 g/dL (ref 3.5–5.0)
ALK PHOS: 68 U/L (ref 38–126)
ALT: 52 U/L (ref 14–54)
AST: 42 U/L — ABNORMAL HIGH (ref 15–41)
Anion gap: 8 (ref 5–15)
BILIRUBIN TOTAL: 0.5 mg/dL (ref 0.3–1.2)
BUN: 13 mg/dL (ref 6–20)
CALCIUM: 9.9 mg/dL (ref 8.9–10.3)
CO2: 27 mmol/L (ref 22–32)
CREATININE: 0.68 mg/dL (ref 0.44–1.00)
Chloride: 106 mmol/L (ref 101–111)
GFR calc Af Amer: >60 mL/min (ref >60)
GFR calc non Af Amer: >60 mL/min (ref >60)
GLUCOSE: 96 mg/dL (ref 65–99)
Potassium: 4.3 mmol/L (ref 3.5–5.1)
SODIUM: 141 mmol/L (ref 135–145)
TOTAL PROTEIN: 7.8 g/dL (ref 6.5–8.1)

## 2016-04-20 LAB — HCG, SERUM, QUALITATIVE: Preg, Serum: NEGATIVE

## 2016-04-24 NOTE — Anesthesia Preprocedure Evaluation (Addendum)
Anesthesia Evaluation  Patient identified by MRN, date of birth, ID band Patient awake    Reviewed: Allergy & Precautions, H&P , NPO status , Patient's Chart, lab work & pertinent test results  History of Anesthesia Complications (+) PONV and history of anesthetic complications  Airway Mallampati: I  TM Distance: >3 FB Neck ROM: Full    Dental no notable dental hx.    Pulmonary neg pulmonary ROS,    Pulmonary exam normal breath sounds clear to auscultation       Cardiovascular negative cardio ROS Normal cardiovascular exam Rhythm:Regular Rate:Normal     Neuro/Psych  Headaches, negative psych ROS   GI/Hepatic Neg liver ROS, GERD  Medicated,  Endo/Other  Hypothyroidism Morbid obesity  Renal/GU negative Renal ROS     Musculoskeletal negative musculoskeletal ROS (+)   Abdominal (+) + obese,   Peds  Hematology negative hematology ROS (+)   Anesthesia Other Findings   Reproductive/Obstetrics negative OB ROS                           Anesthesia Physical Anesthesia Plan  ASA: II  Anesthesia Plan: General   Post-op Pain Management:    Induction: Intravenous  Airway Management Planned: Oral ETT  Additional Equipment:   Intra-op Plan:   Post-operative Plan: Extubation in OR  Informed Consent: I have reviewed the patients History and Physical, chart, labs and discussed the procedure including the risks, benefits and alternatives for the proposed anesthesia with the patient or authorized representative who has indicated his/her understanding and acceptance.   Dental advisory given  Plan Discussed with: CRNA and Surgeon  Anesthesia Plan Comments:        Anesthesia Quick Evaluation

## 2016-04-25 ENCOUNTER — Inpatient Hospital Stay (HOSPITAL_COMMUNITY): Payer: BC Managed Care – PPO | Admitting: Anesthesiology

## 2016-04-25 ENCOUNTER — Encounter (HOSPITAL_COMMUNITY)
Admission: RE | Disposition: A | Discharge status: Home / Self Care | Payer: Self-pay | Source: Ambulatory Visit | Attending: General Surgery

## 2016-04-25 ENCOUNTER — Encounter (HOSPITAL_COMMUNITY): Payer: Self-pay | Admitting: *Deleted

## 2016-04-25 ENCOUNTER — Inpatient Hospital Stay (HOSPITAL_COMMUNITY)
Admission: RE | Admit: 2016-04-25 | Discharge: 2016-04-26 | DRG: 621 | Disposition: A | Discharge status: Home / Self Care | Payer: BC Managed Care – PPO | Source: Ambulatory Visit | Attending: General Surgery | Admitting: General Surgery

## 2016-04-25 DIAGNOSIS — K219 Gastro-esophageal reflux disease without esophagitis: Secondary | ICD-10-CM | POA: Diagnosis present

## 2016-04-25 DIAGNOSIS — G4733 Obstructive sleep apnea (adult) (pediatric): Secondary | ICD-10-CM | POA: Diagnosis present

## 2016-04-25 DIAGNOSIS — E039 Hypothyroidism, unspecified: Secondary | ICD-10-CM | POA: Diagnosis present

## 2016-04-25 DIAGNOSIS — Z823 Family history of stroke: Secondary | ICD-10-CM | POA: Diagnosis not present

## 2016-04-25 DIAGNOSIS — N393 Stress incontinence (female) (male): Secondary | ICD-10-CM | POA: Diagnosis present

## 2016-04-25 DIAGNOSIS — K449 Diaphragmatic hernia without obstruction or gangrene: Secondary | ICD-10-CM | POA: Diagnosis present

## 2016-04-25 DIAGNOSIS — Z79899 Other long term (current) drug therapy: Secondary | ICD-10-CM | POA: Diagnosis not present

## 2016-04-25 DIAGNOSIS — Z8249 Family history of ischemic heart disease and other diseases of the circulatory system: Secondary | ICD-10-CM

## 2016-04-25 DIAGNOSIS — E6609 Other obesity due to excess calories: Secondary | ICD-10-CM

## 2016-04-25 DIAGNOSIS — Z6841 Body Mass Index (BMI) 40.0 and over, adult: Secondary | ICD-10-CM | POA: Diagnosis not present

## 2016-04-25 HISTORY — PX: LAPAROSCOPIC GASTRIC SLEEVE RESECTION WITH HIATAL HERNIA REPAIR: SHX6512

## 2016-04-25 LAB — HEMOGLOBIN AND HEMATOCRIT, BLOOD
HCT: 41.6 % (ref 36.0–46.0)
Hemoglobin: 14 g/dL (ref 12.0–15.0)

## 2016-04-25 SURGERY — GASTRECTOMY, SLEEVE, LAPAROSCOPIC, WITH HIATAL HERNIA REPAIR
Anesthesia: General | Site: Abdomen

## 2016-04-25 MED ORDER — STERILE WATER FOR IRRIGATION IR SOLN
Status: DC | PRN
  Administered 2016-04-25: 1000 mL

## 2016-04-25 MED ORDER — MORPHINE SULFATE (PF) 2 MG/ML IV SOLN
2.0000 mg | INTRAVENOUS | Status: DC | PRN
  Administered 2016-04-25 – 2016-04-26 (×6): 2 mg via INTRAVENOUS
  Filled 2016-04-25 (×6): qty 1

## 2016-04-25 MED ORDER — MIDAZOLAM HCL 5 MG/5ML IJ SOLN
INTRAMUSCULAR | Status: DC | PRN
  Administered 2016-04-25: 2 mg via INTRAVENOUS

## 2016-04-25 MED ORDER — BUPIVACAINE LIPOSOME 1.3 % IJ SUSP
20.0000 mL | Freq: Once | INTRAMUSCULAR | Status: AC
  Administered 2016-04-25: 20 mL
  Filled 2016-04-25: qty 20

## 2016-04-25 MED ORDER — CEFAZOLIN SODIUM-DEXTROSE 2-4 GM/100ML-% IV SOLN
2.0000 g | INTRAVENOUS | Status: AC
  Administered 2016-04-25: 2 g via INTRAVENOUS

## 2016-04-25 MED ORDER — CEFAZOLIN SODIUM-DEXTROSE 2-4 GM/100ML-% IV SOLN
INTRAVENOUS | Status: AC
  Filled 2016-04-25: qty 100

## 2016-04-25 MED ORDER — LIDOCAINE HCL (CARDIAC) 20 MG/ML IV SOLN
INTRAVENOUS | Status: AC
  Filled 2016-04-25: qty 5

## 2016-04-25 MED ORDER — HYDROMORPHONE HCL 1 MG/ML IJ SOLN
0.2500 mg | INTRAMUSCULAR | Status: DC | PRN
  Administered 2016-04-25 (×2): 0.5 mg via INTRAVENOUS

## 2016-04-25 MED ORDER — DEXAMETHASONE SODIUM PHOSPHATE 10 MG/ML IJ SOLN
INTRAMUSCULAR | Status: AC
  Filled 2016-04-25: qty 1

## 2016-04-25 MED ORDER — SUGAMMADEX SODIUM 200 MG/2ML IV SOLN
INTRAVENOUS | Status: AC
  Filled 2016-04-25: qty 2

## 2016-04-25 MED ORDER — PROPOFOL 500 MG/50ML IV EMUL
INTRAVENOUS | Status: DC | PRN
  Administered 2016-04-25: 20 ug/kg/min via INTRAVENOUS

## 2016-04-25 MED ORDER — BUPIVACAINE-EPINEPHRINE 0.25% -1:200000 IJ SOLN
INTRAMUSCULAR | Status: AC
  Filled 2016-04-25: qty 1

## 2016-04-25 MED ORDER — ACETAMINOPHEN 160 MG/5ML PO SOLN
325.0000 mg | ORAL | Status: DC | PRN
Start: 2016-04-26 — End: 2016-04-25

## 2016-04-25 MED ORDER — ROCURONIUM BROMIDE 100 MG/10ML IV SOLN
INTRAVENOUS | Status: DC | PRN
  Administered 2016-04-25 (×4): 10 mg via INTRAVENOUS
  Administered 2016-04-25: 50 mg via INTRAVENOUS

## 2016-04-25 MED ORDER — SODIUM CHLORIDE 0.9 % IJ SOLN
INTRAMUSCULAR | Status: AC
  Filled 2016-04-25: qty 10

## 2016-04-25 MED ORDER — EVICEL 5 ML EX KIT
PACK | Freq: Once | CUTANEOUS | Status: DC
  Filled 2016-04-25: qty 1

## 2016-04-25 MED ORDER — FENTANYL CITRATE (PF) 100 MCG/2ML IJ SOLN
INTRAMUSCULAR | Status: DC | PRN
  Administered 2016-04-25: 50 ug via INTRAVENOUS
  Administered 2016-04-25: 100 ug via INTRAVENOUS
  Administered 2016-04-25 (×2): 50 ug via INTRAVENOUS

## 2016-04-25 MED ORDER — SODIUM CHLORIDE 0.9 % IJ SOLN
INTRAMUSCULAR | Status: AC
  Filled 2016-04-25: qty 50

## 2016-04-25 MED ORDER — OXYCODONE HCL 5 MG/5ML PO SOLN
5.0000 mg | ORAL | Status: DC | PRN
  Administered 2016-04-25: 10 mg via ORAL
  Administered 2016-04-26: 5 mg via ORAL
  Administered 2016-04-26: 10 mg via ORAL
  Filled 2016-04-25: qty 10
  Filled 2016-04-25 (×2): qty 5

## 2016-04-25 MED ORDER — CHLORHEXIDINE GLUCONATE CLOTH 2 % EX PADS: 6.0000 | MEDICATED_PAD | Freq: Once | CUTANEOUS | Status: DC

## 2016-04-25 MED ORDER — SCOPOLAMINE 1 MG/3DAYS TD PT72
MEDICATED_PATCH | TRANSDERMAL | Status: AC
  Filled 2016-04-25: qty 1

## 2016-04-25 MED ORDER — HYDROMORPHONE HCL 2 MG/ML IJ SOLN
INTRAMUSCULAR | Status: AC
  Filled 2016-04-25: qty 1

## 2016-04-25 MED ORDER — LEVOTHYROXINE SODIUM 100 MCG IV SOLR
56.0000 ug | Freq: Every day | INTRAVENOUS | Status: DC
  Administered 2016-04-26: 56 ug via INTRAVENOUS
  Filled 2016-04-25: qty 5

## 2016-04-25 MED ORDER — OXYCODONE HCL 5 MG/5ML PO SOLN
5.0000 mg | ORAL | Status: DC | PRN
  Filled 2016-04-25: qty 10

## 2016-04-25 MED ORDER — LACTATED RINGERS IV SOLN
INTRAVENOUS | Status: DC | PRN
  Administered 2016-04-25 (×2): via INTRAVENOUS

## 2016-04-25 MED ORDER — KCL IN DEXTROSE-NACL 20-5-0.9 MEQ/L-%-% IV SOLN
INTRAVENOUS | Status: DC
  Administered 2016-04-25: 1000 mL via INTRAVENOUS
  Administered 2016-04-25: 12:00:00 via INTRAVENOUS
  Administered 2016-04-26: 1000 mL via INTRAVENOUS
  Filled 2016-04-25 (×5): qty 1000

## 2016-04-25 MED ORDER — MIDAZOLAM HCL 2 MG/2ML IJ SOLN
INTRAMUSCULAR | Status: AC
  Filled 2016-04-25: qty 2

## 2016-04-25 MED ORDER — EVICEL 5 ML EX KIT
PACK | Freq: Once | CUTANEOUS | Status: AC
  Administered 2016-04-25: 5 mL
  Filled 2016-04-25: qty 1

## 2016-04-25 MED ORDER — ACETAMINOPHEN 160 MG/5ML PO SOLN
650.0000 mg | ORAL | Status: DC | PRN
  Filled 2016-04-25: qty 20.3

## 2016-04-25 MED ORDER — DEXAMETHASONE SODIUM PHOSPHATE 10 MG/ML IJ SOLN
INTRAMUSCULAR | Status: DC | PRN
  Administered 2016-04-25: 10 mg via INTRAVENOUS

## 2016-04-25 MED ORDER — ENOXAPARIN SODIUM 30 MG/0.3ML ~~LOC~~ SOLN
30.0000 mg | Freq: Two times a day (BID) | SUBCUTANEOUS | Status: DC
  Administered 2016-04-25 – 2016-04-26 (×2): 30 mg via SUBCUTANEOUS
  Filled 2016-04-25 (×2): qty 0.3

## 2016-04-25 MED ORDER — SODIUM CHLORIDE 0.9 % IJ SOLN
INTRAMUSCULAR | Status: AC
  Filled 2016-04-25: qty 20

## 2016-04-25 MED ORDER — PROPOFOL 10 MG/ML IV BOLUS
INTRAVENOUS | Status: AC
  Filled 2016-04-25: qty 40

## 2016-04-25 MED ORDER — ROCURONIUM BROMIDE 100 MG/10ML IV SOLN
INTRAVENOUS | Status: AC
  Filled 2016-04-25: qty 1

## 2016-04-25 MED ORDER — SCOPOLAMINE 1 MG/3DAYS TD PT72
MEDICATED_PATCH | TRANSDERMAL | Status: DC | PRN
  Administered 2016-04-25: 1 via TRANSDERMAL

## 2016-04-25 MED ORDER — ONDANSETRON HCL 4 MG/2ML IJ SOLN
INTRAMUSCULAR | Status: AC
  Filled 2016-04-25: qty 2

## 2016-04-25 MED ORDER — PREMIER PROTEIN SHAKE: 2.0000 [oz_av] | ORAL | Status: DC

## 2016-04-25 MED ORDER — HYDROMORPHONE HCL 1 MG/ML IJ SOLN
INTRAMUSCULAR | Status: DC | PRN
  Administered 2016-04-25 (×2): 0.5 mg via INTRAVENOUS

## 2016-04-25 MED ORDER — SODIUM CHLORIDE 0.9 % IJ SOLN
INTRAMUSCULAR | Status: DC | PRN
  Administered 2016-04-25: 60 mL

## 2016-04-25 MED ORDER — HEPARIN SODIUM (PORCINE) 5000 UNIT/ML IJ SOLN
5000.0000 [IU] | INTRAMUSCULAR | Status: AC
  Administered 2016-04-25: 5000 [IU] via SUBCUTANEOUS
  Filled 2016-04-25: qty 1

## 2016-04-25 MED ORDER — EPHEDRINE SULFATE 50 MG/ML IJ SOLN
INTRAMUSCULAR | Status: AC
  Filled 2016-04-25: qty 1

## 2016-04-25 MED ORDER — PROPOFOL 10 MG/ML IV BOLUS
INTRAVENOUS | Status: AC
  Filled 2016-04-25: qty 20

## 2016-04-25 MED ORDER — ACETAMINOPHEN 160 MG/5ML PO SOLN: 325.0000 mg | ORAL | Status: DC | PRN

## 2016-04-25 MED ORDER — HYDROMORPHONE HCL 1 MG/ML IJ SOLN
INTRAMUSCULAR | Status: AC
  Filled 2016-04-25: qty 1

## 2016-04-25 MED ORDER — PROMETHAZINE HCL 25 MG/ML IJ SOLN: 6.2500 mg | INTRAMUSCULAR | Status: DC | PRN

## 2016-04-25 MED ORDER — BUPIVACAINE-EPINEPHRINE 0.25% -1:200000 IJ SOLN
INTRAMUSCULAR | Status: DC | PRN
  Administered 2016-04-25: 35 mL

## 2016-04-25 MED ORDER — SUGAMMADEX SODIUM 200 MG/2ML IV SOLN
INTRAVENOUS | Status: DC | PRN
  Administered 2016-04-25: 400 mg via INTRAVENOUS

## 2016-04-25 MED ORDER — 0.9 % SODIUM CHLORIDE (POUR BTL) OPTIME
TOPICAL | Status: DC | PRN
  Administered 2016-04-25: 1000 mL

## 2016-04-25 MED ORDER — FAMOTIDINE IN NACL 20-0.9 MG/50ML-% IV SOLN
20.0000 mg | Freq: Two times a day (BID) | INTRAVENOUS | Status: DC
Start: 2016-04-25 — End: 2016-04-26
  Administered 2016-04-25 – 2016-04-26 (×3): 20 mg via INTRAVENOUS
  Filled 2016-04-25 (×4): qty 50

## 2016-04-25 MED ORDER — FENTANYL CITRATE (PF) 250 MCG/5ML IJ SOLN
INTRAMUSCULAR | Status: AC
  Filled 2016-04-25: qty 5

## 2016-04-25 MED ORDER — LACTATED RINGERS IR SOLN
Status: DC | PRN
  Administered 2016-04-25: 3000 mL

## 2016-04-25 MED ORDER — LIDOCAINE HCL (CARDIAC) 20 MG/ML IV SOLN
INTRAVENOUS | Status: DC | PRN
  Administered 2016-04-25: 50 mg via INTRAVENOUS

## 2016-04-25 MED ORDER — ONDANSETRON HCL 4 MG/2ML IJ SOLN: 4.0000 mg | INTRAMUSCULAR | Status: DC | PRN

## 2016-04-25 MED ORDER — PROPOFOL 10 MG/ML IV BOLUS
INTRAVENOUS | Status: DC | PRN
  Administered 2016-04-25: 200 mg via INTRAVENOUS

## 2016-04-25 SURGICAL SUPPLY — 74 items
APPLICATOR COTTON TIP 6IN STRL (MISCELLANEOUS) IMPLANT
APPLIER CLIP ROT 10 11.4 M/L (STAPLE)
APPLIER CLIP ROT 13.4 12 LRG (CLIP) ×3
APR CLP LRG 13.4X12 ROT 20 MLT (CLIP) ×1
APR CLP MED LRG 11.4X10 (STAPLE)
BAG SPEC RTRVL LRG 6X4 10 (ENDOMECHANICALS)
BLADE SURG SZ11 CARB STEEL (BLADE) ×3 IMPLANT
CABLE HIGH FREQUENCY MONO STRZ (ELECTRODE) ×3 IMPLANT
CHLORAPREP W/TINT 26ML (MISCELLANEOUS) ×5 IMPLANT
CLIP APPLIE ROT 10 11.4 M/L (STAPLE) IMPLANT
CLIP APPLIE ROT 13.4 12 LRG (CLIP) IMPLANT
COVER SURGICAL LIGHT HANDLE (MISCELLANEOUS) ×2 IMPLANT
DEVICE SUT QUICK LOAD TK 5 (STAPLE) ×2 IMPLANT
DEVICE SUT TI-KNOT TK 5X26 (MISCELLANEOUS) ×2 IMPLANT
DEVICE SUTURE ENDOST 10MM (ENDOMECHANICALS) ×2 IMPLANT
DEVICE TI KNOT TK5 (MISCELLANEOUS) ×2
DEVICE TROCAR PUNCTURE CLOSURE (ENDOMECHANICALS) ×3 IMPLANT
DRAPE UTILITY XL STRL (DRAPES) ×6 IMPLANT
ELECT REM PT RETURN 9FT ADLT (ELECTROSURGICAL) ×3
ELECTRODE REM PT RTRN 9FT ADLT (ELECTROSURGICAL) ×1 IMPLANT
GAUZE SPONGE 4X4 12PLY STRL (GAUZE/BANDAGES/DRESSINGS) IMPLANT
GLOVE BIO SURGEON STRL SZ7 (GLOVE) ×2 IMPLANT
GLOVE BIO SURGEON STRL SZ8 (GLOVE) ×2 IMPLANT
GLOVE BIOGEL PI IND STRL 7.0 (GLOVE) IMPLANT
GLOVE BIOGEL PI IND STRL 7.5 (GLOVE) ×1 IMPLANT
GLOVE BIOGEL PI INDICATOR 7.0 (GLOVE) ×4
GLOVE BIOGEL PI INDICATOR 7.5 (GLOVE) ×6
GLOVE ECLIPSE 7.5 STRL STRAW (GLOVE) ×3 IMPLANT
GLOVE SURG SS PI 7.0 STRL IVOR (GLOVE) ×2 IMPLANT
GLOVE SURG SS PI 7.5 STRL IVOR (GLOVE) ×2 IMPLANT
GOWN STRL REUS W/TWL LRG LVL3 (GOWN DISPOSABLE) ×2 IMPLANT
GOWN STRL REUS W/TWL XL LVL3 (GOWN DISPOSABLE) ×12 IMPLANT
HOVERMATT SINGLE USE (MISCELLANEOUS) ×3 IMPLANT
KIT BASIN OR (CUSTOM PROCEDURE TRAY) ×3 IMPLANT
LIQUID BAND (GAUZE/BANDAGES/DRESSINGS) ×3 IMPLANT
MARKER SKIN DUAL TIP RULER LAB (MISCELLANEOUS) ×3 IMPLANT
NDL SPNL 22GX3.5 QUINCKE BK (NEEDLE) ×1 IMPLANT
NEEDLE SPNL 22GX3.5 QUINCKE BK (NEEDLE) ×3 IMPLANT
PACK UNIVERSAL I (CUSTOM PROCEDURE TRAY) ×3 IMPLANT
POUCH SPECIMEN RETRIEVAL 10MM (ENDOMECHANICALS) IMPLANT
QUICK LOAD TK 5 (STAPLE) ×2
RELOAD STAPLE 60 3.6 BLU REG (STAPLE) ×1 IMPLANT
RELOAD STAPLE 60 3.8 GOLD REG (STAPLE) ×1 IMPLANT
RELOAD STAPLE 60 4.1 GRN THCK (STAPLE) ×1 IMPLANT
RELOAD STAPLER BLUE 60MM (STAPLE) ×3 IMPLANT
RELOAD STAPLER GOLD 60MM (STAPLE) ×2 IMPLANT
RELOAD STAPLER GREEN 60MM (STAPLE) ×2 IMPLANT
SCISSORS LAP 5X45 EPIX DISP (ENDOMECHANICALS) ×3 IMPLANT
SET IRRIG TUBING LAPAROSCOPIC (IRRIGATION / IRRIGATOR) ×3 IMPLANT
SHEARS HARMONIC ACE PLUS 45CM (MISCELLANEOUS) ×3 IMPLANT
SLEEVE ADV FIXATION 5X100MM (TROCAR) ×3 IMPLANT
SLEEVE GASTRECTOMY 36FR VISIGI (MISCELLANEOUS) ×3 IMPLANT
SOLUTION ANTI FOG 6CC (MISCELLANEOUS) ×3 IMPLANT
SPONGE LAP 18X18 X RAY DECT (DISPOSABLE) ×3 IMPLANT
STAPLER ECHELON LONG 60 440 (INSTRUMENTS) ×3 IMPLANT
STAPLER RELOAD BLUE 60MM (STAPLE) ×9
STAPLER RELOAD GOLD 60MM (STAPLE) ×6
STAPLER RELOAD GREEN 60MM (STAPLE) ×6
SUT DEVICE BRAIDED 0X39 (SUTURE) IMPLANT
SUT MNCRL AB 4-0 PS2 18 (SUTURE) ×3 IMPLANT
SUT SURGIDAC NAB ES-9 0 48 120 (SUTURE) ×2 IMPLANT
SUT VICRYL 0 TIES 12 18 (SUTURE) ×3 IMPLANT
SYR 10ML ECCENTRIC (SYRINGE) ×3 IMPLANT
SYR 20CC LL (SYRINGE) ×3 IMPLANT
TIP RIGID 35CM EVICEL (HEMOSTASIS) ×3 IMPLANT
TOWEL OR 17X26 10 PK STRL BLUE (TOWEL DISPOSABLE) ×3 IMPLANT
TOWEL OR NON WOVEN STRL DISP B (DISPOSABLE) ×3 IMPLANT
TROCAR ADV FIXATION 5X100MM (TROCAR) ×3 IMPLANT
TROCAR BLADELESS 15MM (ENDOMECHANICALS) ×3 IMPLANT
TROCAR BLADELESS OPT 5 100 (ENDOMECHANICALS) ×3 IMPLANT
TUBING CONNECTING 10 (TUBING) ×2 IMPLANT
TUBING CONNECTING 10' (TUBING) ×1
TUBING ENDO SMARTCAP PENTAX (MISCELLANEOUS) ×3 IMPLANT
TUBING INSUF HEATED (TUBING) ×3 IMPLANT

## 2016-04-25 NOTE — Progress Notes (Signed)
Offered to set up a CPAP for QHS, but pt refused. She stated she does not use CPAP at home and does not want to use it here at the hospital.

## 2016-04-25 NOTE — H&P (Signed)
History of Present Illness Bonnie Schwartz(Bonnie Schwartz T. Sander Speckman MD; 04/14/2016 9:50 AM) Patient words: Pre op.  The patient is a 55 year old female who presents with obesity. She was referred by Dr Nicholos Johnseade for consideration for surgical treatment for morbid obesity. The patient gives a history of progressive obesity since early adulthood despite multiple attempts at medical management. She has been through innumerable efforts at nonsurgical weight loss including Weight Watchers at least 10 times and the weight loss clinic using Fen/Phen. She has been able to lose up to 40-50 pounds at a time but then experiences progressive weight regain. Obesity has been affecting the patient in a number of ways including significant joint pain and inability to enjoy and participate in routine activities. There is a lot of obesity in her family and she is very concerned about her future health. She so far has avoided major comorbidities but does have some mild GERD which she treats with over-the-counter medications and some stress urinary incontinence but mainly significant worsening chronic joint pain.   After initial consultation she elected to proceed with laparoscopic sleeve gastrectomy. She has successfully completed her preoperative workup. No concerns on psychologic or nutrition evaluation. Lab work was unremarkable. Upper GI series showed a small hiatal hernia. She has been doing well since I originally saw her with no significant illnesses or hospitalizations    Problem List/Past Medical Bonnie Schwartz(Bonnie Carmicheal T Ioma Chismar, MD; 04/14/2016 9:50 AM) POST-OPERATIVE STATE (Z98.890) OBESITY, MORBID, BMI 40.0-49.9 (E66.01)  Other Problems Bonnie Schwartz(Bonnie Fournier T Arslan Kier, MD; 04/14/2016 9:50 AM) Thyroid Disease Migraine Headache Sleep Apnea Gastroesophageal Reflux Disease Back Pain  Past Surgical History Bonnie Schwartz(Bonnie Leys T Amman Bartel, MD; 04/14/2016 9:50 AM) Joesph FillersAnal Fissure Repair Oral Surgery Gallbladder Surgery - Laparoscopic  Diagnostic  Studies History Bonnie Schwartz(Bonnie Krisher T Matayah Reyburn, MD; 04/14/2016 9:50 AM) Mammogram within last year Pap Smear 1-5 years ago Colonoscopy 1-5 years ago within last year  Allergies Bonnie Schwartz(Bonnie Schwartz, CMA; 04/14/2016 9:29 AM) Minocycline HCl *TETRACYCLINES* Erythromycin Estolate *CHEMICALS*  Medication History Bonnie Schwartz(Bonnie Denise T Seng Larch, MD; 04/14/2016 9:50 AM) Multiple Vitamin (Oral) Active. LORazepam (1MG  Tablet, Oral) Active. Levothyroxine Sodium (88MCG Tablet, Oral) Active. Vitamin B12 (3000MCG/ML Liquid, Sublingual) Active. BuPROPion HCl ER (XL) (150MG  Tablet ER 24HR, Oral) Active. Vitamin D (Cholecalciferol) (1000UNIT Tablet, Oral) Active. Biotin (10MG  Tablet, Oral) Active. Fish Oil (1000MG  Capsule DR, Oral) Active. Medications Reconciled OxyCODONE HCl (5MG /5ML Solution, 5-10 Milliliter Oral every four hours, as needed, Taken starting 04/14/2016) Active. Protonix (40MG  Tablet DR, 1 (one) Tablet Oral daily, Taken starting 04/14/2016) Active. Zofran ODT (4MG  Tablet Disperse, 1 (one) Tablet Oral every six hours, as needed, Taken starting 04/14/2016) Active. B Complex Vitamins (Injection) Active.  Social History Bonnie Schwartz(Bonnie Wander T Wagner Tanzi, MD; 04/14/2016 9:50 AM) Tobacco use Never smoker. No drug use No alcohol use Caffeine use Carbonated beverages.  Family History Bonnie Schwartz(Bonnie Tregre T Dajae Kizer, MD; 04/14/2016 9:50 AM) Hypertension Mother. Thyroid problems Mother. Migraine Headache Father. Cancer Brother, Mother. Breast Cancer Family Members In General. Colon Cancer Family Members In General. Cerebrovascular Accident Father.  Pregnancy / Birth History Bonnie Schwartz(Bonnie Berling T Payzlee Ryder, MD; 04/14/2016 9:50 AM) Age of menopause 7151-55 Age at menarche 13 years. Irregular periods Gravida 2 Para 2 Maternal age 37-25  Vitals Bonnie Schwartz(Bonnie Schwartz CMA; 04/14/2016 9:31 AM) 04/14/2016 9:31 AM Weight: 251 lb Height: 65in Body Surface Area: 2.18 m Body Mass Index: 41.77 kg/m  Temp.: 97.55F  Pulse: 99  (Regular)  BP: 120/82 (Sitting, Left Arm, Standard)       Physical Exam Bonnie Schwartz(Nigel Wessman T. Braysen Cloward MD; 04/14/2016 9:51 AM) The physical exam findings are as  follows: Note:General: Alert, obese Caucasian female, in no distress Skin: Warm and dry without rash or infection. HEENT: No palpable masses or thyromegaly. Sclera nonicteric. Lymph nodes: No cervical, supraclavicular, nodes palpable. Lungs: Breath sounds clear and equal. No wheezing or increased work of breathing. Cardiovascular: Regular rate and rhythm without murmer. No JVD or edema. Peripheral pulses intact. No carotid bruits. Abdomen: Nondistended. Soft and nontender. No masses palpable. No organomegaly. No palpable hernias. Extremities: No edema or joint swelling or deformity. No chronic venous stasis changes. Neurologic: Alert and fully oriented. Gait normal. No focal weakness. Psychiatric: Normal mood and affect. Thought content appropriate with normal judgement and insight    Assessment & Plan Bonnie Schwartz T. Shavontae Gibeault MD; 04/14/2016 9:52 AM) OBESITY, MORBID, BMI 40.0-49.9 (E66.01) Impression: Patient with progressive morbid obesity unresponsive to multiple efforts at medical management who presents with a BMI of 41 and comorbidities of chronic joint pain, stress urinary incontinence and mild GERD. I believe there would be very significant medical benefit from surgical weight loss. After our discussion of surgical options currently available the patient has decided to proceed with laparoscopic sleeve gastrectomy due to somewhat reduced complications compared to bypass and less risk of nutritional issues and dumping. She already has some symptoms suggestive of dumping. She has successfully completed her workup and is ready for surgery. We again reviewed the procedure and risks and all of her questions and her husband's questions were answered. She is given prescriptions for pain and nausea medication and Protonix

## 2016-04-25 NOTE — Transfer of Care (Signed)
Immediate Anesthesia Transfer of Care Note  Patient: Bonnie Schwartz  Procedure(s) Performed: Procedure(s): LAPAROSCOPIC GASTRIC SLEEVE RESECTION AND UPPER ENDO  (N/A)  Patient Location: PACU  Anesthesia Type:General  Level of Consciousness:  sedated, patient cooperative and responds to stimulation  Airway & Oxygen Therapy:Patient Spontanous Breathing and Patient connected to face mask oxgen  Post-op Assessment:  Report given to PACU RN and Post -op Vital signs reviewed and stable  Post vital signs:  Reviewed and stable  Last Vitals:  Filed Vitals:   04/25/16 0535  BP: 154/93  Pulse: 94  Temp: 37.3 C  Resp: 16    Complications: No apparent anesthesia complications

## 2016-04-25 NOTE — Op Note (Signed)
Preoperative Diagnosis: Morbid Obesity, OSA, Hiatal Hernia  Postoprative Diagnosis: Morbid Obesity, OSA, Hiatal Hernia  Procedure: Procedure(s): LAPAROSCOPIC GASTRIC SLEEVE RESECTION, HIATAL HERNIA REPAIR AND UPPER ENDO    Surgeon: Glenna Fellows T   Assistants: Luretha Murphy  Anesthesia:  General endotracheal anesthesia  Indications: patient is a 55 year old femalewith progressive morbid obesity unresponsive to medical management he presented to the BMI 41 comorbidities of obstructive sleep apnea, chronic joint pain, stress urinary incontinence and mild GERD. After extensive preoperative workup and discussion detailed elsewhere we have elected to proceed with laparoscopic sleeve gastrectomy for surgical treatment of her morbid obesity.    Procedure Detail:  Patient was brought to the operating room, placed in the supine position on the operating table, and general endotracheal anesthesia induced. She received subcutaneous heparin and preoperative IV antibiotics.PAS were in place. The abdomen was widely sterilely prepped and draped. Patient timeout was performed and correct procedure verified. Access was obtained with a 5 mm Optiview trocar in the left upper quadrant without difficulty and pneumoperitoneum established. There was no evidence of trocar injury or bleeding. Under direct vision a 5 mm trocar was placedin the right upper quadrant laterally, a 15 mm trocar at the base of the falciform ligamentin the right upper abdomen,a 5 mm trocar above the left knee umbilicus for the camera port. A 5 mm subxiphoid site and Nathanson retractor was placed to the left lobe of the liver elevated with excellent exposure of the stomach and the hiatus. The patient was placed in steep reverse Trendelenburg. An initial 5 mm trocar was placed laterally in the left upper quadrant. Examination of the hiatus didn't appear to show some dimpling and the patientwas felt to have a small hiatal hernia on upper GI  series. I elected to explore the hiatus. The gastrohepatic ligament was divided in an avascular area and dissection carried up over the EG junction. The right crus was exposed and the peritoneum incised anterior to this and careful blunt dissection carried back to the retroesophageal space. There was found to be a moderate sized hiatal hernia. The left crus was identified and dissected. A repair of the hiatus was performed with 2 interrupted 0 Surgilon sutures closing this back to a normal-appearing size. beginningalong the mid greater curve of the vasculature wasdissected at the edge of the stomach with harmonic scalpel and the lesser sac entered. The dissection progressed proximally along the greater curve. Short gastric vessels were individually divided with the Harmonic scalpel.Dissection was carried up to the hiatus. The left crus was completely dissected along its length The fundus was completely freed. The esophageal fat pad was dissected off the crus.The dissection then continued distally along the greater curve. We continuedto a spot measured 5 cm from the pylorus. At this point the stomach was completely freed along its lesser curve vasculature.The VisiG 80 [place] was passed orally into the stomach and positioned along the lesser curve at the tip of the pylorus and placed on continuous suction. Beginning distally at about 5 cm from the pylorus and initial firing of a green load echelon 60 mm stapler was performed angling well away from the incisura. A second firing of the green 60 mm stapler was then used to continue the staple line passed the incisura allowing some extra room adjacent to the tubeat this point. A gold load 60 mm stapler was used. A third firing workingbacktoward the tube. The sleeve was then completed with 3 firings of theblue load 60 mmstaplerstaying adjacent to the VisiG to but  not tight against it and the final firing angling out away from the EG junction slightly just outside the  esophageal fat pad to complete the sleeve. The sleeve was insufflated with the VisiG tube under saline irrigation and there was no leak. He was seen to be a nice taper with no narrowing or twisting. The sleeve was desufflated and VisiG tube removed. A few bleeding points along the staple line were controlled with clips. Dr. Daphine DeutscherMartin performed upper endoscopy showing no narrowing or bleeding. The staple line was coated with Evicel. The gastrectomy specimen was brought out through the 15 mm trocar site after dilating this. The fascia at the site was closed with interrupted 0 Vicryl. The abdomen was inspected for bleedingor injury and everything looked fine. The Nathanson retractor was removed under direct vision. A bilateral TAP block under direct vision was performed with Exparel diluted to 80 mL. Trocar sites were also individually infiltrated. All CO2 was evacuated trochars removed. Skin is closed with subcutaneous thickening of Monocryl and Liquiban. Sponge needle and instrument counts were correct.    Findings: As above  Estimated Blood Loss:  less than 50 mL         Drains: none  Blood Given: none          Specimens: greater curvature of stomach        Complications:  * No complications entered in OR log *         Disposition: PACU - hemodynamically stable.         Condition: stable

## 2016-04-25 NOTE — Op Note (Signed)
Bonnie Schwartz [id number] 04/03/1961 04/25/2016  Preoperative diagnosis: sleeve in progress  Postoperative diagnosis: Same   Procedure: Upper endoscopy   Surgeon: Susy FrizzleMatt B. Daphine DeutscherMartin  M.D., FACS   Anesthesia: Gen.   Indications for procedure: This patient was undergoing a sleeve gastrectomy and hiatal hernia repair by Dr. Johna SheriffHoxworth.    Description of procedure: The endoscopy was placed in the mouth and into the oropharynx and under endoscopic vision it was advanced to the esophagogastric junction.  The pouch was insufflated and the nicely cylindrical sleeve was examined from the EG junction to the antrum.  No bleeding from the staple line.  .   No bleeding or leaks were detected on the outside with insufflation.  The scope was withdrawn without difficulty.     Matt B. Daphine DeutscherMartin, MD, FACS General, Bariatric, & Minimally Invasive Surgery [facility], [place]

## 2016-04-25 NOTE — Op Note (Deleted)
Milas Kocheratricia L Feng [id number] 1961/05/10 04/25/2016  Preoperative diagnosis: morbid obesity  Postoperative diagnosis: Same   Procedure: upper endoscopy   Surgeon: Mary SellaEric M. Marlei Glomski M.D., FACS   Anesthesia: Gen.   Indications for procedure: 55 y.o. year old female undergoing Laparoscopic Gastric Sleeve Resection and an EGD was requested to evaluate the new gastric sleeve.   Description of procedure: After we have completed the sleeve resection, I scrubbed out and obtained the Olympus endoscope. I gently placed endoscope in the patient's oropharynx and gently glided it down the esophagus without any difficulty under direct visualization. Once I was in the gastric sleeve, I insufflated the stomach with air. I was able to cannulate and advanced the scope through the gastric sleeve. I was able to cannulate the duodenum with ease. Dr. Ezzard StandingNewman had placed saline in the upper abdomen. Upon further insufflation of the gastric sleeve there was no evidence of bubbles. GE junction located at 41 cm.  Upon further inspection of the gastric sleeve, the mucosa appeared normal. There is no evidence of any mucosal abnormality. The sleeve was widely patent at the angularis. There was no evidence of bleeding. The gastric sleeve was decompressed. The scope was withdrawn. The patient tolerated this portion of the procedure well. Please see Dr Allene PyoNewman's operative note for details regarding the laparoscopic gastric sleeve resection.   Mary SellaEric M. Andrey CampanileWilson, MD, FACS  General, Bariatric, & Minimally Invasive Surgery  [facility], [place]

## 2016-04-25 NOTE — Progress Notes (Signed)
Patient alert and oriented, op day.  Provided support and encouragement.  Encouraged pulmonary toilet, ambulation and small sips of liquids.  All questions answered.  Will continue to monitor.  

## 2016-04-25 NOTE — Anesthesia Postprocedure Evaluation (Signed)
Anesthesia Post Note  Patient: Bonnie Schwartz  Procedure(s) Performed: Procedure(s) (LRB): LAPAROSCOPIC GASTRIC SLEEVE RESECTION AND UPPER ENDO  (N/A)  Patient location during evaluation: PACU Anesthesia Type: General Level of consciousness: awake and alert Pain management: pain level controlled Vital Signs Assessment: post-procedure vital signs reviewed and stable Respiratory status: spontaneous breathing, nonlabored ventilation, respiratory function stable and patient connected to nasal cannula oxygen Cardiovascular status: blood pressure returned to baseline and stable Postop Assessment: no signs of nausea or vomiting Anesthetic complications: no    Last Vitals:  Filed Vitals:   04/25/16 1045 04/25/16 1100  BP: 162/100 156/92  Pulse: 88 91  Temp: 36.4 C 36.5 C  Resp: 16 16    Last Pain:  Filed Vitals:   04/25/16 1133  PainSc: 8                  Newman Waren,JAMES TERRILL

## 2016-04-25 NOTE — Anesthesia Procedure Notes (Signed)
Procedure Name: Intubation Date/Time: 04/25/2016 7:20 AM Performed by: Naasir Carreira, Nuala AlphaKRISTOPHER Pre-anesthesia Checklist: Patient identified, Emergency Drugs available, Suction available, Patient being monitored and Timeout performed Patient Re-evaluated:Patient Re-evaluated prior to inductionOxygen Delivery Method: Circle system utilized Preoxygenation: Pre-oxygenation with 100% oxygen Intubation Type: IV induction Ventilation: Mask ventilation without difficulty Laryngoscope Size: Mac and 3 Grade View: Grade II Tube type: Oral Tube size: 7.0 mm Number of attempts: 1 Airway Equipment and Method: Stylet Placement Confirmation: ETT inserted through vocal cords under direct vision,  positive ETCO2,  CO2 detector and breath sounds checked- equal and bilateral Secured at: 21 cm Tube secured with: Tape Dental Injury: Teeth and Oropharynx as per pre-operative assessment

## 2016-04-25 NOTE — Interval H&P Note (Signed)
History and Physical Interval Note:  04/25/2016 7:14 AM  Bonnie KocherPatricia L Bechard  has presented today for surgery, with the diagnosis of Morbid Obesity, OSA, Hiatal Hernia  The various methods of treatment have been discussed with the patient and family. After consideration of risks, benefits and other options for treatment, the patient has consented to  Procedure(s): LAPAROSCOPIC GASTRIC SLEEVE RESECTION, UPPER ENDO WITH POSSIBLE HIATAL HERNIA REPAIR (N/A) as a surgical intervention .  The patient's history has been reviewed, patient examined, no change in status, stable for surgery.  I have reviewed the patient's chart and labs.  Questions were answered to the patient's satisfaction.     Dlynn Ranes T

## 2016-04-26 LAB — CBC WITH DIFFERENTIAL/PLATELET
BASOS PCT: 0 %
Basophils Absolute: 0 10*3/uL (ref 0.0–0.1)
EOS ABS: 0 10*3/uL (ref 0.0–0.7)
EOS PCT: 0 %
HEMATOCRIT: 41.1 % (ref 36.0–46.0)
Hemoglobin: 13.5 g/dL (ref 12.0–15.0)
Lymphocytes Relative: 21 %
Lymphs Abs: 2.4 10*3/uL (ref 0.7–4.0)
MCH: 28.4 pg (ref 26.0–34.0)
MCHC: 32.8 g/dL (ref 30.0–36.0)
MCV: 86.5 fL (ref 78.0–100.0)
MONO ABS: 0.8 10*3/uL (ref 0.1–1.0)
MONOS PCT: 7 %
Neutro Abs: 8 10*3/uL — ABNORMAL HIGH (ref 1.7–7.7)
Neutrophils Relative %: 72 %
Platelets: 300 10*3/uL (ref 150–400)
RBC: 4.75 MIL/uL (ref 3.87–5.11)
RDW: 15.4 % (ref 11.5–15.5)
WBC: 11.2 10*3/uL — ABNORMAL HIGH (ref 4.0–10.5)

## 2016-04-26 MED ORDER — ACETAMINOPHEN 160 MG/5ML PO SOLN
650.0000 mg | Freq: Four times a day (QID) | ORAL | Status: DC | PRN
  Administered 2016-04-26 (×2): 650 mg via ORAL
  Filled 2016-04-26: qty 20.3

## 2016-04-26 NOTE — Progress Notes (Signed)
Pt's vitals are WNL, tolerating diet and pain is under control. Discussed discharge instructions with patient. All concerns and questions addressed. Discharged to home with prescriptions.

## 2016-04-26 NOTE — Progress Notes (Signed)
Patient ID: Bonnie Schwartz, female   DOB: August 16, 1961, 55 y.o.   MRN: [id number] 1 Day Post-Op  Subjective: Doing well.A little nauseated yesterday now resolved, pain well controlled. Has been tolerating water and ice chips  Objective: Vital signs in last 24 hours: Temp:  [97.6 F (36.4 C)-98.8 F (37.1 C)] 98.1 F (36.7 C) (07/19 0641) Pulse Rate:  [63-104] 66 (07/19 0641) Resp:  [14-19] 16 (07/19 0641) BP: (125-165)/(68-107) 132/68 mmHg (07/19 0641) SpO2:  [94 %-100 %] 98 % (07/19 0641) Last BM Date: 04/24/16  Intake/Output from previous day: 07/18 0701 - 07/19 0700 In: 3954.6 [P.O.:240; I.V.:3714.6] Out: 2825 [Urine:2800; Blood:25] Intake/Output this shift:    General appearance: alert, cooperative and no distress GI: normal findings: bowel sounds normal and soft, non-tender Incision/Wound: clean and dry  Lab Results:   Recent Labs  04/25/16 1848 04/26/16 0449  WBC  --  11.2*  HGB 14.0 13.5  HCT 41.6 41.1  PLT  --  300   BMET No results for input(s): NA, K, CL, CO2, GLUCOSE, BUN, CREATININE, CALCIUM in the last 72 hours.   Studies/Results: No results found.  Anti-infectives: Anti-infectives    Start     Dose/Rate Route Frequency Ordered Stop   04/25/16 0600  ceFAZolin (ANCEF) IVPB 2g/100 mL premix     2 g 200 mL/hr over 30 Minutes Intravenous On call to O.R. 04/25/16 0529 04/25/16 0730      Assessment/Plan: s/p Procedure(s): LAPAROSCOPIC GASTRIC SLEEVE RESECTION AND UPPER ENDO  Doing well postoperatively without apparent complication. She will start protein shakes this morning and if tolerated well came go home later today.   LOS: 1 day    Krew Hortman T 04/26/2016

## 2016-04-26 NOTE — Progress Notes (Signed)
Patient alert and oriented, pain is controlled. Patient is tolerating fluids, advanced to protein shake today, patient is tolerating well.  Reviewed Gastric sleeve discharge instructions with patient and patient is able to articulate understanding.  Provided information on BELT program, Support Group and [facility] outpatient pharmacy. All questions answered, will continue to monitor.  

## 2016-04-26 NOTE — Discharge Summary (Signed)
Patient ID: Bonnie Kocheratricia L Maragh [id number] 55 y.o. 27-Apr-1961  04/25/2016  Discharge date and time: 04/26/2016   Admitting Physician: Glenna FellowsHOXWORTH,Roberta Kelly T  Discharge Physician: Glenna FellowsHOXWORTH,Farhad Burleson T  Admission Diagnoses: Morbid Obesity, OSA, Hiatal Hernia  Discharge Diagnoses: same  Operations: Procedure(s): LAPAROSCOPIC GASTRIC SLEEVE RESECTION with hiatal hernia repair AND UPPER ENDO   Admission Condition: good  Discharged Condition: good  Indication for Admission: Patient with progressive morbid obesity unresponsive to multiple efforts at medical management who presents with a BMI of 41 and comorbidities of chronic joint pain, stress urinary incontinence and mild GERD.  fter extensive preoperative workup and discussion detailed elsewhere she is electively admitted for laparoscopic sleeve gastrectomy for treatment of her morbid obesity  Hospital Course: on the morning of admission the patient underwent an uneventful laparoscopic sleeve gastrectomy with repair of a moderate sized hiatal hernia. She tolerated the procedure well. On the day of surgery she had some mild nausea and expected discomfort well controlled.  She was given water and ice chips which she tolerated. On the first postoperative day her nausea is resolved and pain is improved. She is advanced to protein shakes and planned dischargelater today if tolerated well.  Abdomen is nontender. Wounds clean. CBC unremarkable and vital signs all within normal limits.   Disposition: Home  Patient Instructions:    Medication List    STOP taking these medications        oxyCODONE 5 MG immediate release tablet  Commonly known as:  Oxy IR/ROXICODONE      TAKE these medications        acetaminophen 500 MG tablet  Commonly known as:  TYLENOL  Take 500 mg by mouth every 6 (six) hours as needed for moderate pain.     buPROPion 150 MG 12 hr tablet  Commonly known as:  ZYBAN  Take 150 mg by mouth daily.  Notes to Patient:   This medication cannot be crushed or cut in half, if too large to swallow you will need to discuss with prescribing physician the need to change to an immediate release formula     CALCIUM CITRATE + PO  Take 1 tablet by mouth daily.     cholecalciferol 1000 units tablet  Commonly known as:  VITAMIN D  Take 1,000 Units by mouth daily.     cyclobenzaprine 10 MG tablet  Commonly known as:  FLEXERIL  Take 10 mg by mouth 3 (three) times daily as needed for muscle spasms.     famotidine 10 MG chewable tablet  Commonly known as:  PEPCID AC  Chew 10 mg by mouth 2 (two) times daily as needed for heartburn.     FISH OIL + D3 PO  Take 1 capsule by mouth daily.     fluticasone 27.5 MCG/SPRAY nasal spray  Commonly known as:  VERAMYST  Place 2 sprays into the nose daily.     HAIR/SKIN/NAILS/BIOTIN PO  Take 1 capsule by mouth daily.     levothyroxine 112 MCG tablet  Commonly known as:  SYNTHROID, LEVOTHROID  Take 112 mcg by mouth daily before breakfast.     LORazepam 1 MG tablet  Commonly known as:  ATIVAN  Take 1 mg by mouth at bedtime. Reported on 02/24/2016     multivitamin with minerals Tabs tablet  Take 1 tablet by mouth daily.     valACYclovir 1000 MG tablet  Commonly known as:  VALTREX  Take 1,000 mg by mouth as needed.     VITAMIN B COMPLEX IJ  Inject as directed every 30 (thirty) days.     zolpidem 10 MG tablet  Commonly known as:  AMBIEN  Take 10 mg by mouth as needed for sleep.        Activity: activity as tolerated Diet: bariatric protein shakes Wound Care: none needed  Follow-up:  With Dr. Johna Sheriff in 3 weeks.  Signed: [name] MD, FACS  04/26/2016, 8:21 AM

## 2016-04-26 NOTE — Plan of Care (Signed)
Problem: Food- and Nutrition-Related Knowledge Deficit (NB-1.1) Goal: Nutrition education Formal process to instruct or train a patient/client in a skill or to impart knowledge to help patients/clients voluntarily manage or modify food choices and eating behavior to maintain or improve health. Outcome: Completed/Met Date Met:  04/26/16 Nutrition Education Note  Received consult for diet education per DROP protocol.   Discussed 2 week post op diet with pt. Emphasized that liquids must be non carbonated, non caffeinated, and sugar free. Fluid goals discussed. Reviewed progression of diet to include soft proteins at 7-10 days post-op. Pt to follow up with outpatient bariatric RD for further diet progression after 2 weeks. Multivitamins and minerals also reviewed. Teach back method used, pt expressed understanding, expect good compliance.   Diet: First 2 Weeks  You will see the dietitian about two (2) weeks after your surgery. The dietitian will increase the types of foods you can eat if you are handling liquids well:  If you have severe vomiting or nausea and cannot handle clear liquids lasting longer than 1 day, call your surgeon  Protein Shake  Drink at least 2 ounces of shake 5-6 times per day  Each serving of protein shakes (usually 8 - 12 ounces) should have a minimum of:  15 grams of protein  And no more than 5 grams of carbohydrate  Goal for protein each day:  Men = 80 grams per day  Women = 60 grams per day  Protein powder may be added to fluids such as non-fat milk or Lactaid milk or Soy milk (limit to 35 grams added protein powder per serving)   Hydration  Slowly increase the amount of water and other clear liquids as tolerated (See Acceptable Fluids)  Slowly increase the amount of protein shake as tolerated  Sip fluids slowly and throughout the day  May use sugar substitutes in small amounts (no more than 6 - 8 packets per day; i.e. Splenda)   Fluid Goal  The first goal is to  drink at least 8 ounces of protein shake/drink per day (or as directed by the nutritionist); some examples of protein shakes are [employer], [employer]R [employer], EAS Edge HP, and Unjury. See handout from pre-op Bariatric Education Class:  Slowly increase the amount of protein shake you drink as tolerated  You may find it easier to slowly sip shakes throughout the day  It is important to get your proteins in first  Your fluid goal is to drink 64 - 100 ounces of fluid daily  It may take a few weeks to build up to this  32 oz (or more) should be clear liquids  And  32 oz (or more) should be full liquids (see below for examples)  Liquids should not contain sugar, caffeine, or carbonation   Clear Liquids:  Water or Sugar-free flavored water (i.e. Fruit H2O, Propel)  Decaffeinated coffee or tea (sugar-free)  Crystal Lite, Wyler's Lite, Minute Maid Lite  Sugar-free Jell-O  Bouillon or broth  Sugar-free Popsicle: *Less than 20 calories each; Limit 1 per day   Full Liquids:  Protein Shakes/Drinks + 2 choices per day of other full liquids  Full liquids must be:  No More Than 12 grams of Carbs per serving  No More Than 3 grams of Fat per serving  Strained low-fat cream soup  Non-Fat milk  Fat-free Lactaid Milk  Sugar-free yogurt (Dannon Lite & Fit, Greek yogurt)     Clayton Bibles, MS, RD, LDN Pager: [phone] After Hours Pager: [phone]

## 2016-04-26 NOTE — Discharge Instructions (Signed)

## 2016-05-01 ENCOUNTER — Telehealth (HOSPITAL_COMMUNITY): Payer: Self-pay

## 2016-05-01 NOTE — Telephone Encounter (Signed)
  Attempted DROP discharge call, no answer, left message to return call   Made discharge phone call to patient per DROP protocol. Asking the following questions.    1. Do you have someone to care for you now that you are home?   2. Are you having pain now that is not relieved by your pain medication?   3. Are you able to drink the recommended daily amount of fluids (48 ounces minimum/day) and protein (60-80 grams/day) as prescribed by the dietitian or nutritional counselor?   4. Are you taking the vitamins and minerals as prescribed?   5. Do you have the "on call" number to contact your surgeon if you have a problem or question?   6. Are your incisions free of redness, swelling or drainage? (If steri strips, address that these can fall off, shower as tolerated)  7. Have your bowels moved since your surgery?  If not, are you passing gas?   8. Are you up and walking 3-4 times per day?       

## 2016-05-09 ENCOUNTER — Encounter: Payer: BC Managed Care – PPO | Attending: General Surgery

## 2016-05-09 DIAGNOSIS — Z713 Dietary counseling and surveillance: Secondary | ICD-10-CM | POA: Insufficient documentation

## 2016-05-09 DIAGNOSIS — Z6841 Body Mass Index (BMI) 40.0 and over, adult: Secondary | ICD-10-CM | POA: Insufficient documentation

## 2016-05-09 NOTE — Progress Notes (Signed)
Bariatric Class:  Appt start time: 1530 end time:  1630.  2 Week Post-Operative Nutrition Class  Patient was seen on 05/09/2016 for Post-[facility]ative Nutrition [facility].   Surgery date: 04/25/2016 Surgery type: sleeve gastrectomy Start weight at [facility]: 250 lbs on 02/24/2016 Weight today: 233.8 lbs  Weight change: 18.4 lbs  TANITA  BODY COMP RESULTS  04/03/16 05/09/16   BMI (kg/m^2) 42 38.9   Fat Mass (lbs) 133.6 122.2   Fat Free Mass (lbs) 118.6 111.6   Total Body Water (lbs) 87 81.2   The following the learning objectives were met by the patient during this course:  Identifies Phase 3A (Soft, High Proteins) Dietary Goals and will begin from 2 weeks post-operatively to 2 months post-operatively  Identifies appropriate sources of fluids and proteins   States protein recommendations and appropriate sources post-operatively  Identifies the need for appropriate texture modifications, mastication, and bite sizes when consuming solids  Identifies appropriate multivitamin and calcium sources post-operatively  Describes the need for physical activity post-operatively and will follow MD recommendations  States when to call healthcare provider regarding medication questions or post-operative complications  Handouts given during class include:  Phase 3A: Soft, High Protein Diet Handout  Follow-Up Plan: Patient will follow-up at [facility] in 6 weeks for 2 month post-op nutrition visit for diet advancement per MD.

## 2016-07-04 ENCOUNTER — Encounter: Payer: Self-pay | Admitting: Podiatry

## 2016-07-04 ENCOUNTER — Ambulatory Visit (INDEPENDENT_AMBULATORY_CARE_PROVIDER_SITE_OTHER): Payer: BC Managed Care – PPO

## 2016-07-04 ENCOUNTER — Other Ambulatory Visit: Payer: Self-pay | Admitting: Podiatry

## 2016-07-04 ENCOUNTER — Ambulatory Visit (INDEPENDENT_AMBULATORY_CARE_PROVIDER_SITE_OTHER): Payer: BC Managed Care – PPO | Admitting: Podiatry

## 2016-07-04 VITALS — BP 118/74 | HR 74 | Resp 14 | Ht 65.0 in | Wt 213.0 lb

## 2016-07-04 DIAGNOSIS — R52 Pain, unspecified: Secondary | ICD-10-CM

## 2016-07-04 DIAGNOSIS — M205X1 Other deformities of toe(s) (acquired), right foot: Secondary | ICD-10-CM

## 2016-07-04 NOTE — Patient Instructions (Signed)
Today we discussed treatment options for your painful right great toe joint including conservative care, turf toe extension on the orthotic to prevent movement of the toe joint versus possible surgical treatment Will schedule you for a consult with Dr. Logan BoresEvans  Hallux Rigidus Hallux rigidus is a condition involving pain and a loss of motion of the first (big) toe. The pain gets worse with lifting up (extension) of the toe. This is usually due to arthritic bony bumps (spurring) of the joint at the base of the big toe.  SYMPTOMS   Pain, with lifting up of the toe.  Tenderness over the joint where the big toe meets the foot.  Redness, swelling, and warmth over the top of the base of the big toe (sometimes).  Foot pain, stiffness, and limping. CAUSES  Hallux rigidus is caused by arthritis of the joint where the big toe meets the foot. The arthritis creates a bone spur that pinches the soft tissues when the toe is extended. RISK INCREASES WITH:  Tight shoes with a narrow toe box.  Family history of foot problems.  Gout and rheumatoid and psoriatic arthritis.  History of previous toe injury, including "turf toe."  Long first toe, flat feet, and other big toe bony bumps.  Arthritis of the big toe. PREVENTION   Wear wide-toed shoes that fit well.  Tape the big toe to reduce motion and to prevent pinching of the tissues between the bone.  Maintain physical fitness:  Foot and ankle flexibility.  Muscle strength and endurance. PROGNOSIS  This condition can usually be managed with proper treatment. However, surgery is typically required to prevent the problem from recurring.  RELATED COMPLICATIONS  Injury to other areas of the foot or ankle, caused by abnormal walking in an attempt to avoid the pain felt when walking normally. TREATMENT Treatment first involves stopping the activities that aggravate your symptoms. Ice and medicine can be used to reduce the pain and inflammation.  Modifications to shoes may help reduce pain, including wearing stiff-soled shoes, shoes with a wide toe box, inserting a padded donut to relieve pressure on top of the joint, or wearing an arch support. Corticosteroid injections may be given to reduce inflammation. If nonsurgical treatment is unsuccessful, surgery may be needed. Surgical options include removing the arthritic bony spur, cutting a bone in the foot to change the arc of motion (allowing the toe to extend more), or fusion of the joint (eliminating all motion in the joint at the base of the big toe).  MEDICATION   If pain medicine is needed, nonsteroidal anti-inflammatory medicines (aspirin and ibuprofen), or other minor pain relievers (acetaminophen), are often advised.  Do not take pain medicine for 7 days before surgery.  Prescription pain relievers are usually prescribed only after surgery. Use only as directed and only as much as you need.  Ointments for arthritis, applied to the skin, may give some relief.  Injections of corticosteroids may be given to reduce inflammation. HEAT AND COLD  Cold treatment (icing) relieves pain and reduces inflammation. Cold treatment should be applied for 10 to 15 minutes every 2 to 3 hours, and immediately after activity that aggravates your symptoms. Use ice packs or an ice massage.  Heat treatment may be used before performing the stretching and strengthening activities prescribed by your caregiver, physical therapist, or athletic trainer. Use a heat pack or a warm water soak. SEEK MEDICAL CARE IF:   Symptoms get worse or do not improve in 2 weeks, despite treatment.  After surgery you develop fever, increasing pain, redness, swelling, drainage of fluids, bleeding, or increasing warmth.  New, unexplained symptoms develop. (Drugs used in treatment may produce side effects.)   This information is not intended to replace advice given to you by your health care provider. Make sure you discuss  any questions you have with your health care provider.   Document Released: 09/25/2005 Document Revised: 10/16/2014 Document Reviewed: 01/07/2009 Elsevier Interactive Patient Education [employer]

## 2016-07-04 NOTE — Progress Notes (Signed)
   Subjective:    Patient ID: Bonnie Schwartz, female    DOB: [birth year], 55 y.o.   MRN: [id number]  HPI   This patient presents today with a long history of pain in or around the right great toe joint area with symptoms developing approximately 10 years ago and becoming acute in the past year. Patient states that she discontinued her daily walking because of ongoing pain in the joint which prevents her from being as active as she wants. She says she's tried a variety of shoes including rocker sole shoes, over-the-counter supports without a reduction of the pain. The pain occurs upon weight-bearing and walking and is relieved with rest and elevation.  Patient has undergone recent bariatric surgery in July 2017 with a weight reduction from 251 pounds to approximately 210 pounds. She is approximated 5 foot 5 inches  Review of Systems  All other systems reviewed and are negative.      Objective:   Physical Exam  Orientated 3  Vascular: No peripheral edema bilaterally No calf pain bilaterally DP and PT pulses 2/4 bilaterally Capillary reflex immediate bilaterally  Neurological: Sensation to 10 g monofilament wire intact 5/5 bilaterally Vibratory sensation intact bilaterally Ankle reflex equal and reactive bilaterally  Dermatological: Texture and turgor within normal limits bilaterally No skin lesions bilaterally  Musculoskeletal: Prominence dorsal right first MPJ Exquisite tenderness to palpation dorsal first MPJ right and plantar first MPJ or sesamoids. 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 Conservative treatment would be a orthotic with a turf toe extension. Surgical treatment could include possible decompression osteotomy first metatarsal. Patient has interested in considering possible surgical intervention for ongoing painful right first MPJ  Reschedule patient for consult with Dr. Logan BoresEvans

## 2016-07-12 ENCOUNTER — Ambulatory Visit (INDEPENDENT_AMBULATORY_CARE_PROVIDER_SITE_OTHER): Payer: BC Managed Care – PPO | Admitting: Podiatry

## 2016-07-12 DIAGNOSIS — M79671 Pain in right foot: Secondary | ICD-10-CM | POA: Diagnosis not present

## 2016-07-12 DIAGNOSIS — M205X1 Other deformities of toe(s) (acquired), right foot: Secondary | ICD-10-CM | POA: Diagnosis not present

## 2016-07-12 NOTE — Patient Instructions (Signed)

## 2016-07-13 ENCOUNTER — Encounter: Payer: Self-pay | Admitting: Dietician

## 2016-07-13 ENCOUNTER — Encounter: Payer: BC Managed Care – PPO | Attending: General Surgery | Admitting: Dietician

## 2016-07-13 DIAGNOSIS — Z713 Dietary counseling and surveillance: Secondary | ICD-10-CM | POA: Diagnosis not present

## 2016-07-13 DIAGNOSIS — Z6841 Body Mass Index (BMI) 40.0 and over, adult: Secondary | ICD-10-CM

## 2016-07-13 NOTE — Progress Notes (Signed)
  Follow-up visit:  11 Weeks Post-Operative Sleeve gastrectomy Surgery  Medical Nutrition Therapy:  Appt start time: 0805 end time:  0835.  Primary concerns today: Post-operative Bariatric Surgery Nutrition Management.  Elease Hashimotoatricia returns having lost a total of 35 lbs. She feels like her weight loss is slow. Has tried some cooked veggies and tolerates. Tolerating all recommended foods. No vomiting and very aware of fullness cues. She reports that she joined [employer] through her work and modifying her diet. Likes the support of the group and the accountability of weigh-ins.   Surgery date: 04/25/2016 Surgery type: sleeve gastrectomy [facility] lbs on 02/24/2016 Weight today: 214.6 lbs Weight change: 19.2 lbs Total weight loss: 35.4 lbs  Goal: BMI 24  TANITA  BODY COMP RESULTS  04/03/16 05/09/16 07/13/16   BMI (kg/m^2) 42 38.9 35.7   Fat Mass (lbs) 133.6 122.2 106.2   Fat Free Mass (lbs) 118.6 111.6 108.4   Total Body Water (lbs) 87 81.2 78.2    Preferred Learning Style:   No preference indicated   Learning Readiness:   Ready  24-hr recall: B (AM): Premier protein shake, 2 eggs with cheese on the weekends (14-30g) Snk (10AM): sometimes a cheese stick, cottage cheese, Pure protein bar, or yogurt (7-15g)  L (PM): Wendy's chili or only chicken breast from [employer] or [employer] On[employer] or other frozen meal and only eats meat (14g) Snk (PM):   D (PM): lean ground beef or another protein shake (14-30g) Snk (PM):   Fluid intake: 11-22 oz protein shake; Vitamin water zero, water; 50-70 oz total per day per patient Estimated total protein intake: 75-90 g/day  Medications: see list, no changes Supplementation: taking, sometimes forgets 3rd calcium  Using straws: no Drinking while eating: no Hair loss: none, taking Biotin Carbonated beverages: no N/V/D/C: none Dumping syndrome: no  Recent physical activity:  Walking during work and on weekends, yardwork and gardening  (4-5x a week)  Progress Towards Goal(s):  In progress.  Handouts given during visit include:  Phase 3B lean protein + non starchy vegetables   Nutritional Diagnosis:  [place]-3.3 Overweight/obesity related to past poor dietary habits and physical inactivity as evidenced by patient w/ recent sleeve gastrectomy surgery following dietary guidelines for continued weight loss.  Intervention:  Nutrition counseling provided.  Teaching Method Utilized:  Visual Auditory Hands on  Barriers to learning/adherence to lifestyle change: none  Demonstrated degree of understanding via:  Teach Back   Monitoring/Evaluation:  Dietary intake, exercise, and body weight. Patient wishes to follow up with nutrition as needed.

## 2016-07-13 NOTE — Patient Instructions (Addendum)
Goals:  Follow Phase 3B: High Protein + Non-Starchy Vegetables  Eat 3-6 small meals/snacks, every 3-5 hrs  Increase lean protein foods to meet 60g goal  Increase fluid intake to 64oz +  Avoid drinking 15 minutes before, during and 30 minutes after eating  Aim for >30 min of physical activity daily  Try Citracal Petites (tablet) or Viactiv chews  Surgery date: 04/25/2016 Surgery type: sleeve gastrectomy [facility] lbs on 02/24/2016 Weight today: 214.6 lbs Weight change: 19.2 lbs Total weight loss: 35.4 lbs  Goal: BMI 24   TANITA  BODY COMP RESULTS  04/03/16 05/09/16 07/13/16   BMI (kg/m^2) 42 38.9 35.7   Fat Mass (lbs) 133.6 122.2 106.2   Fat Free Mass (lbs) 118.6 111.6 108.4   Total Body Water (lbs) 87 81.2 78.2

## 2016-07-15 NOTE — Progress Notes (Signed)
Subjective:  Patient presents today for surgical consult for right hallux limitus. Patient's been seen in the past by Dr. Leeanne Deeduchman.    Objective/Physical Exam General: The patient is alert and oriented x3 in no acute distress.  Dermatology: Skin is warm, dry and supple bilateral lower extremities. Negative for open lesions or macerations.  Vascular: Palpable pedal pulses bilaterally. No edema or erythema noted. Capillary refill within normal limits.  Neurological: Epicritic and protective threshold grossly intact bilaterally.   Musculoskeletal Exam: Range of motion within normal limits to all pedal and ankle joints bilateral with exception of limited range of motion to the right first MPJ.. Muscle strength 5/5 in all groups bilateral.   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 Limited range of motion and pain on palpation and range of motion noted. Normal osseous mineralization. Joint spaces preserved. No fracture/dislocation/boney destruction.    Assessment: #1 hallux limitus right foot #2 pain in right foot   Plan of Care:  #1 Patient was evaluated. #2 discussed with patient with conservative versus surgical management of hallux limitus to the right lower extremity. Patient opts for surgical correction. #3 today a postoperative shoe was dispensed. #4 patient is to schedule surgery with surgical coordinator and return to the office when necessary, for 1 week postop   Dr. Felecia ShellingBrent M. Alper Guilmette, DPM [facility]

## 2016-08-28 ENCOUNTER — Other Ambulatory Visit: Payer: Self-pay | Admitting: Family Medicine

## 2016-08-28 ENCOUNTER — Telehealth: Payer: Self-pay | Admitting: *Deleted

## 2016-08-28 DIAGNOSIS — Z1231 Encounter for screening mammogram for malignant neoplasm of breast: Secondary | ICD-10-CM

## 2016-08-28 NOTE — Telephone Encounter (Signed)
"  I have surgery scheduled for December 21 with Dr. Logan BoresEvans.  I need to cancel that.  I am not going to go through with the surgery at this time.  I'm just going to postpone it.  I don't have anyone to take care of my grandson during my recovery."

## 2016-08-30 ENCOUNTER — Ambulatory Visit
Admission: RE | Admit: 2016-08-30 | Discharge: 2016-08-30 | Disposition: A | Discharge status: Home / Self Care | Payer: BC Managed Care – PPO | Source: Ambulatory Visit | Attending: Family Medicine | Admitting: Family Medicine

## 2016-08-30 DIAGNOSIS — Z1231 Encounter for screening mammogram for malignant neoplasm of breast: Secondary | ICD-10-CM

## 2016-09-05 NOTE — Telephone Encounter (Signed)
I am returning your call.  I wanted to let you know surgery has been canceled for 09/28/2016.  Call us if you would like to reschedule or need any conservative care.  "I will thanks."  I called and left a message for Aram [facility]chamCynthia at [facility] to cancel her surgery for 09/28/2016.`

## 2016-10-04 ENCOUNTER — Encounter: Payer: BC Managed Care – PPO | Admitting: Podiatry

## 2017-09-24 ENCOUNTER — Encounter: Payer: Self-pay | Admitting: Neurology

## 2017-09-24 ENCOUNTER — Ambulatory Visit: Payer: BC Managed Care – PPO | Admitting: Neurology

## 2017-09-24 VITALS — BP 108/71 | HR 72 | Ht 65.0 in | Wt 185.4 lb

## 2017-09-24 DIAGNOSIS — H53453 Other localized visual field defect, bilateral: Secondary | ICD-10-CM

## 2017-09-24 DIAGNOSIS — G43509 Persistent migraine aura without cerebral infarction, not intractable, without status migrainosus: Secondary | ICD-10-CM | POA: Diagnosis not present

## 2017-09-24 DIAGNOSIS — R42 Dizziness and giddiness: Secondary | ICD-10-CM | POA: Diagnosis not present

## 2017-09-24 DIAGNOSIS — R29818 Other symptoms and signs involving the nervous system: Secondary | ICD-10-CM | POA: Diagnosis not present

## 2017-09-24 DIAGNOSIS — H543 Unqualified visual loss, both eyes: Secondary | ICD-10-CM

## 2017-09-24 NOTE — Patient Instructions (Addendum)
Magnesium citrate 400mg  to 600mg  daily, riboflavin 400mg  daily, Coenzyme Q 10 100mg  three times daily Consider joining our Facebook group [facility]. Look up CGRP for Migraine, Ajovy or Aimovig Lamictal (Lamotrigine) is excellent for aura without head pain MRI of the brain

## 2017-09-24 NOTE — Progress Notes (Signed)
[facility]    Provider:  Dr Ahern Referring Provider: Elias Elseeade, RoLucia Gaskinsbert, MD Primary Care Physician:  Elias Elseeade, Robert, MD  CC:  Vision loss  HPI:  Bonnie Schwartz is a 56 y.o. female here as a referral from Dr. Nicholos Johnseade for vision loss. She loses vision, diagnose din the past as aura but now changing. She can push through, she can see well enough to drive, it does not debilitate her. Migraine happened in 5th grade. No known family history.   Past medical history migraine with aura. She has had migraines with aura for years. But vision changes are different. The visual aura is different now. She used to get scintillating scotomas. More trouble with vision loss and peripheral vsiion loss recently which is different than her usual aurs. Her brain is "alerting me more" it is more frequent blind spots but never evolves into the full scintillating spreading aura. The pain in the past wa son the right and was throbbing and pulsating with light sensitivity and nausea, last time was 10 years ago when she had a headache with the aura. Since then only aura and sometimes a little nausea and vertigo. She gets a few auras a year last was in July. They are in both eyes. It can last 1-4 hours. She has daily visual disturbances however, shimmers, fireworks. No other focal neurologic deficits, associated symptoms, inciting events or modifiable factors.   Reviewed notes, labs and imaging from outside physicians, which showed:  Cbc, cmp unremarkable  Patient has ocular migraines, auras and flashes spots, last few months noticing migraines or different missing spots in the peripheral vision.  Changes in vision in both eyes, sudden onset of symptoms, missing vision, reports vision loss peripheral bilaterally, symptoms started months ago and are intermittent, she does have a history of ocular migraines for years, gradually worsening, flashes and floaters, negative for eye pain but vision changes now are more  persistent.  Reviewed exam from the eye center including normal slit-lamp and funduscopic exam, she is on Wellbutrin, Synthroid, Ativan and Aldactone, family history of stroke in the father, she is never smoked no alcohol use, visual acuity is 20/40 in the right eye and 20/20 in the left eye, nml visual fields,  exam was dilated, diagnosed with ophthalmic migraine.    Review of Systems: Patient complains of symptoms per HPI as well as the following symptoms: vision loss. Pertinent negatives and positives per HPI. All others negative.   Social History   Socioeconomic History  . Marital status: Married    Spouse name: Not on file  . Number of children: 4  . Years of education: Not on file  . Highest education level: Associate degree: academic program  Social Needs  . Financial resource strain: Not on file  . Food insecurity - worry: Not on file  . Food insecurity - inability: Not on file  . Transportation needs - medical: Not on file  . Transportation needs - non-medical: Not on file  Occupational History  . Not on file  Tobacco Use  . Smoking status: Never Smoker  . Smokeless tobacco: Never Used  Substance and Sexual Activity  . Alcohol use: No  . Drug use: No  . Sexual activity: Yes    Birth control/protection: None, Other-see comments    Comment: ablation 2006  Other Topics Concern  . Not on file  Social History Narrative   Right handed    Lives at home with spouse   Drinks occasional caffeine  Family History  Problem Relation Age of Onset  . Cancer Mother 56       UTERINE  . Diabetes Mother   . Hypertension Mother   . Breast cancer Maternal Grandmother   . Cancer Maternal Grandmother 50       OVARIAN  . Colon cancer Maternal Grandmother   . Stroke Father   . Dementia Father     Past Medical History:  Diagnosis Date  . Back pain    SPONDYLOLISTHESIS  . Complication of anesthesia   . Gallstone    one large gallstone - problems off and on with abd pain  right sided, nausea  . GERD (gastroesophageal reflux disease)   . Headache(784.0)    migraine  with aura - "classic migraine equivalancy"loss of vision, left face goes numb - but no headache-first one was in 5th grade  . Herpes    non active  . Hypothyroidism    hasimotos  . Pain    neck- bulging disc in neck  . PONV (postoperative nausea and vomiting)    occassional N&V, Zofran did not work well for her for past surgeries  . Poor venous access    PT STATES HER VEINS VERY SMALL AND DIFFICULT TO FIND  . Seasonal allergies   . Vitamin B12 deficiency     Past Surgical History:  Procedure Laterality Date  . CHOLECYSTECTOMY N/A 06/19/2014   Procedure: LAPAROSCOPIC CHOLECYSTECTOMY WITH INTRAOPERATIVE CHOLANGIOGRAM;  Surgeon: Avel Peace, MD;  Location: [facility];  Service: General;  Laterality: N/A;  . ENDOMETRIAL ABLATION    . LAPAROSCOPIC GASTRIC SLEEVE RESECTION WITH HIATAL HERNIA REPAIR N/A 04/25/2016   Procedure: LAPAROSCOPIC GASTRIC SLEEVE RESECTION AND UPPER ENDO ;  Surgeon: Glenna Fellows, MD;  Location: [facility];  Service: General;  Laterality: N/A;  . PELVIC FLOOR SURGERY     X2  . perineal fistula repair    . tear duct surgery  10/2015    Current Outpatient Medications  Medication Sig Dispense Refill  . B Complex Vitamins (VITAMIN B COMPLEX IJ) Inject as directed every 30 (thirty) days.    . Calcium Citrate-Vitamin D (CALCIUM CITRATE + PO) Take 1 tablet by mouth daily.    . cholecalciferol (VITAMIN D) 1000 UNITS tablet Take 1,000 Units by mouth daily.    . cyclobenzaprine (FLEXERIL) 10 MG tablet Take 10 mg by mouth 3 (three) times daily as needed for muscle spasms.    . Fish Oil-Cholecalciferol (FISH OIL + D3 PO) Take 1 capsule by mouth daily.     Marland Kitchen levothyroxine (SYNTHROID, LEVOTHROID) 112 MCG tablet Take 112 mcg by mouth daily before breakfast.    . LORazepam (ATIVAN) 1 MG tablet Take 1 mg by mouth at bedtime. Reported on 02/24/2016    . Multiple Vitamin (MULTIVITAMIN  WITH MINERALS) TABS tablet Take 1 tablet by mouth daily.    . Multiple Vitamins-Minerals (HAIR/SKIN/NAILS/BIOTIN PO) Take 1 capsule by mouth daily.    . valACYclovir (VALTREX) 1000 MG tablet Take 1,000 mg by mouth as needed.      No current facility-administered medications for this visit.     Allergies as of 09/24/2017 - Review Complete 09/24/2017  Allergen Reaction Noted  . Minocycline Anaphylaxis 07/09/2013  . Morphine and related Nausea Only 04/20/2016  . Oxycodone Nausea Only 04/20/2016  . Erythromycin Rash 12/11/2013    Vitals: BP 108/71 (BP Location: Right Arm, Patient Position: Sitting)   Pulse 72   Ht 5\' 5"  (1.651 m)   Wt 185 lb 6.4 oz (84.1  kg)   BMI 30.85 kg/m  Last Weight:  Wt Readings from Last 1 Encounters:  09/24/17 185 lb 6.4 oz (84.1 kg)   Last Height:   Ht Readings from Last 1 Encounters:  09/24/17 5\' 5"  (1.651 m)     Physical exam: Exam: Gen: NAD, conversant, well nourised, obese, well groomed                     CV: RRR, no MRG. No Carotid Bruits. No peripheral edema, warm, nontender Eyes: Conjunctivae clear without exudates or hemorrhage  Neuro: Detailed Neurologic Exam  Speech:    Speech is normal; fluent and spontaneous with normal comprehension.  Cognition:    The patient is oriented to person, place, and time;     recent and remote memory intact;     language fluent;     normal attention, concentration,     fund of knowledge Cranial Nerves:    The pupils are equal, round, and reactive to light. The fundi are normal and spontaneous venous pulsations are present. Visual fields are full to finger confrontation. Extraocular movements are intact. Trigeminal sensation is intact and the muscles of mastication are normal. The face is symmetric. The palate elevates in the midline. Hearing intact. Voice is normal. Shoulder shrug is normal. The tongue has normal motion without fasciculations.   Coordination:    Normal finger to nose and heel to shin.  Normal rapid alternating movements.   Gait:    Heel-toe and tandem gait are normal.   Motor Observation:    No asymmetry, no atrophy, and no involuntary movements noted. Tone:    Normal muscle tone.    Posture:    Posture is normal. normal erect    Strength:    Strength is V/V in the upper and lower limbs.      Sensation: intact to LT     Reflex Exam:  DTR's:    Deep tendon reflexes in the upper and lower extremities are normal bilaterally.   Toes:    The toes are downgoing bilaterally.   Clonus:    Clonus is absent.     Assessment/Plan:  [age] year iwht with vision loss likely migraine aura but need to rule out other causes with MRI of the brain to evaluate for stroke, optic neurits/neuropathy, compressive mass, space-occupying tumor, lesion.   MRI brain w/wo contrast as above Magnesium as this has great evidence for decreasing Migraine Aura Can try Lamictal next, great for aura without headache  Orders Placed This Encounter  Procedures  . MR BRAIN W WO CONTRAST     Discussed: To prevent or relieve headaches, try the following: Cool Compress. Lie down and place a cool compress on your head.  Avoid headache triggers. If certain foods or odors seem to have triggered your migraines in the past, avoid them. A headache diary might help you identify triggers.  Include physical activity in your daily routine. Try a daily walk or other moderate aerobic exercise.  Manage stress. Find healthy ways to cope with the stressors, such as delegating tasks on your to-do list.  Practice relaxation techniques. Try deep breathing, yoga, massage and visualization.  Eat regularly. Eating regularly scheduled meals and maintaining a healthy diet might help prevent headaches. Also, drink plenty of fluids.  Follow a regular sleep schedule. Sleep deprivation might contribute to headaches Consider biofeedback. With this mind-body technique, you learn to control certain bodily functions - such as  muscle tension, heart rate and blood pressure -  to prevent headaches or reduce headache pain.    Proceed to emergency room if you experience new or worsening symptoms or symptoms do not resolve, if you have new neurologic symptoms or if headache is severe, or for any concerning symptom.   Provided education and documentation from American headache Society toolbox including articles on: chronic migraine medication overuse headache, chronic migraines, prevention of migraines, behavioral and other nonpharmacologic treatments for headache.  Patient instructions:  Magnesium citrate 400mg  to 600mg  daily, riboflavin 400mg  daily, Coenzyme Q 10 100mg  three times daily Consider joining our Facebook group [facility]. Look up CGRP for Migraine, Ajovy or Aimovig Lamictal (Lamotrigine) is excellent for aura without head pain MRI of the brain   Cc Dr. Russella Dar, MD  [facility] [address] [place], [place] [address]  Phone [phone] Fax [phone]

## 2017-09-25 DIAGNOSIS — G43509 Persistent migraine aura without cerebral infarction, not intractable, without status migrainosus: Secondary | ICD-10-CM | POA: Insufficient documentation

## 2017-10-24 ENCOUNTER — Other Ambulatory Visit: Payer: BC Managed Care – PPO

## 2017-10-31 ENCOUNTER — Ambulatory Visit: Payer: BC Managed Care – PPO

## 2017-10-31 DIAGNOSIS — H543 Unqualified visual loss, both eyes: Secondary | ICD-10-CM | POA: Diagnosis not present

## 2017-10-31 DIAGNOSIS — R42 Dizziness and giddiness: Secondary | ICD-10-CM | POA: Diagnosis not present

## 2017-10-31 DIAGNOSIS — H53453 Other localized visual field defect, bilateral: Secondary | ICD-10-CM

## 2017-10-31 DIAGNOSIS — R29818 Other symptoms and signs involving the nervous system: Secondary | ICD-10-CM | POA: Diagnosis not present

## 2017-10-31 MED ORDER — GADOPENTETATE DIMEGLUMINE 469.01 MG/ML IV SOLN: 15.0000 mL | Freq: Once | INTRAVENOUS | Status: DC | PRN

## 2017-11-01 ENCOUNTER — Telehealth: Payer: Self-pay | Admitting: *Deleted

## 2017-11-01 NOTE — Telephone Encounter (Signed)
-----   Message from Anson FretAntonia B Ahern, MD sent at 11/01/2017 11:16 AM EST ----- Mri brain normal

## 2017-11-01 NOTE — Telephone Encounter (Signed)
Called and LVM asking for call back. If she calls back, please tell her that her MRI of her brain is normal.

## 2017-11-02 ENCOUNTER — Encounter: Payer: Self-pay | Admitting: *Deleted

## 2017-11-02 NOTE — Telephone Encounter (Signed)
Called and spoke with patient. Informed her that her MRI brain is normal. She verbalized understanding  & appreciation. She had no questions.

## 2017-11-02 NOTE — Telephone Encounter (Signed)
error 

## 2017-11-28 ENCOUNTER — Encounter (HOSPITAL_COMMUNITY): Payer: Self-pay

## 2018-05-11 IMAGING — RF DG UGI W/ KUB
13 series · 13 of 13 positions shown · non-contrast
Comparison: None

CLINICAL DATA: Pre bariatric screening

EXAM:
UPPER GI SERIES WITH KUB
TECHNIQUE: After obtaining a scout radiograph a routine upper GI series was
performed using thin barium
FLUOROSCOPY TIME:  Radiation Exposure Index (as provided by the
fluoroscopic device): 32.5 mGy
If the device does not provide the exposure index:
Fluoroscopy Time (in minutes and seconds):  1 minutes 12 seconds
Number of Acquired Images:

[Series 1: t abdomen supine · 0.15mm/px · 1 of 1 slices shown]
[im 1/1]
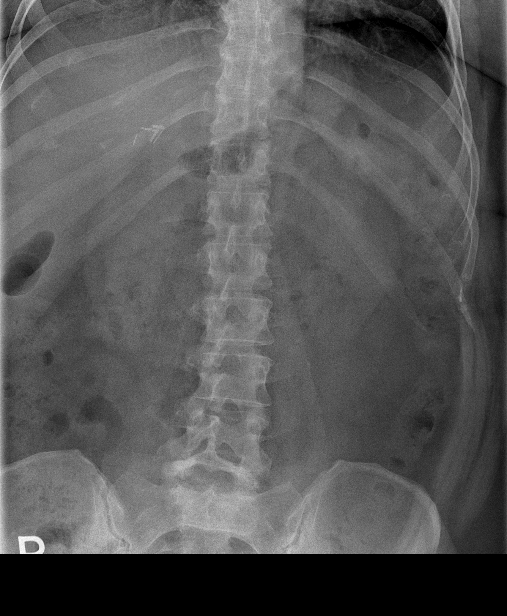

[Series 2: cp_standard · 0.26mm/px · 1 of 1 slices shown (1 of 12)]
[im 1/1]
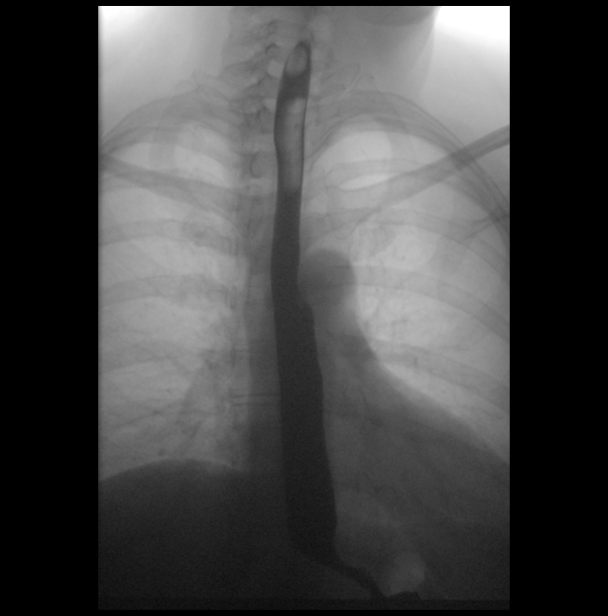

[Series 3: cp_standard · 0.26mm/px · 1 of 1 slices shown (2 of 12)]
[im 1/1]
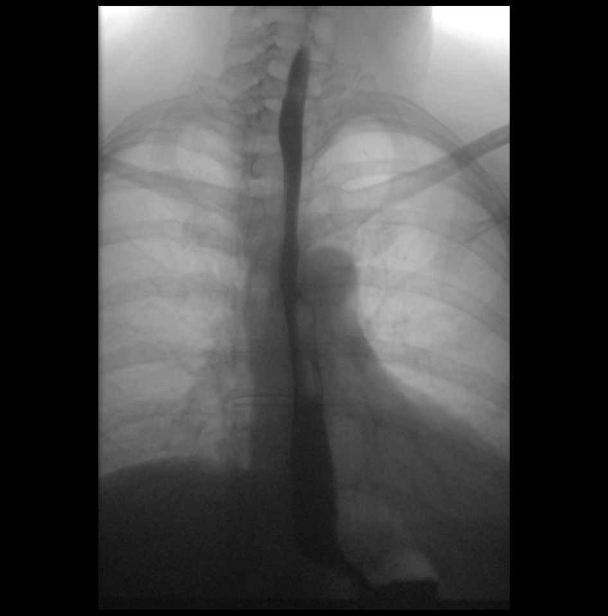

[Series 4: cp_standard · 0.26mm/px · 1 of 1 slices shown (3 of 12)]
[im 1/1]
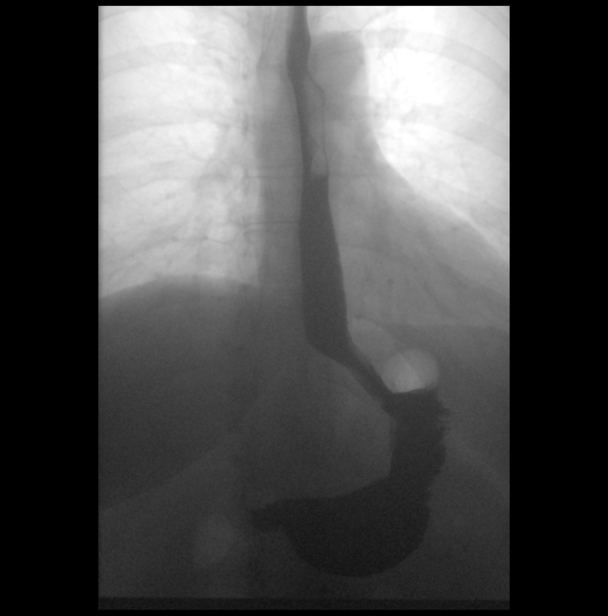

[Series 5: cp_standard · 0.26mm/px · 1 of 1 slices shown (4 of 12)]
[im 1/1]
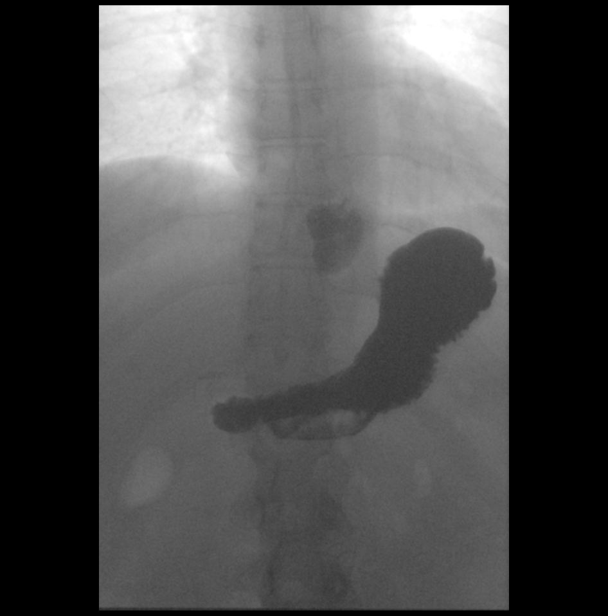

[Series 6: cp_standard · 0.26mm/px · 1 of 1 slices shown (5 of 12)]
[im 1/1]
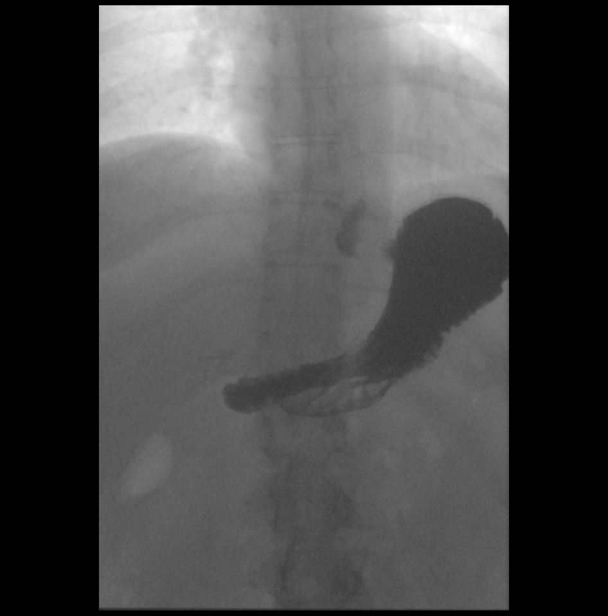

[Series 7: cp_standard · 0.26mm/px · 1 of 1 slices shown (6 of 12)]
[im 1/1]
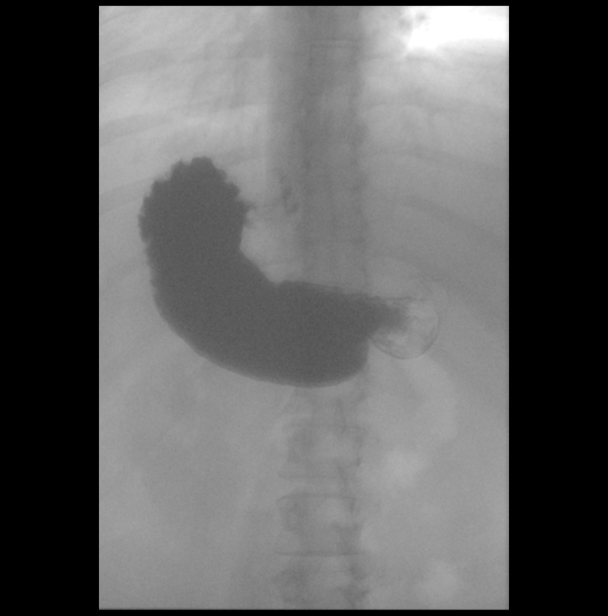

[Series 8: cp_standard · 0.26mm/px · 1 of 1 slices shown (7 of 12)]
[im 1/1]
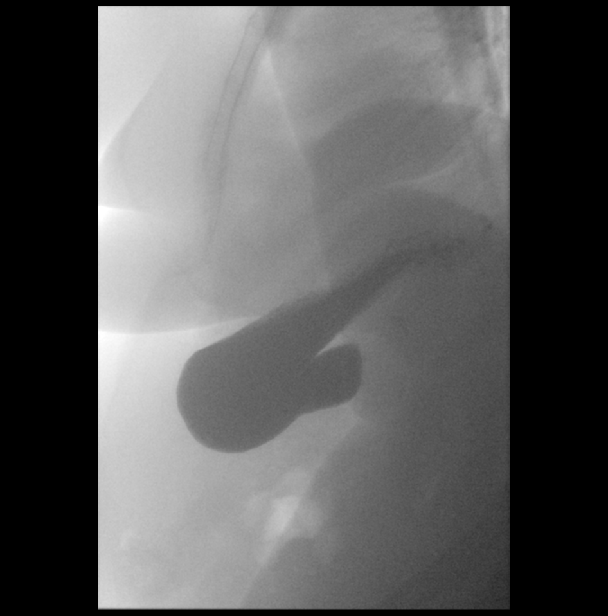

[Series 9: cp_standard · 0.27mm/px · 1 of 1 slices shown (8 of 12)]
[im 1/1]
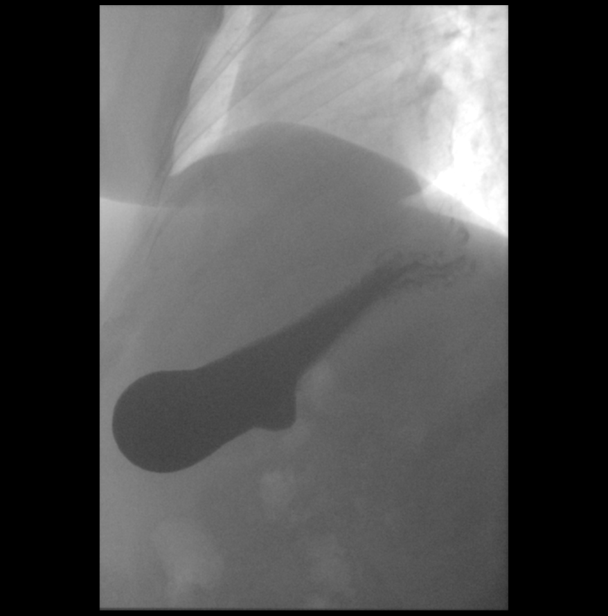

[Series 10: cp_standard · 0.18mm/px · 1 of 1 slices shown (9 of 12)]
[im 1/1]
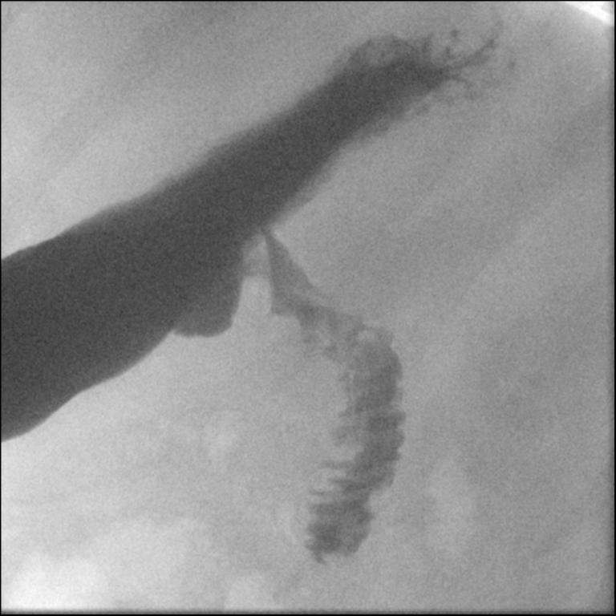

[Series 11: cp_standard · 0.18mm/px · 1 of 1 slices shown (10 of 12)]
[im 1/1]
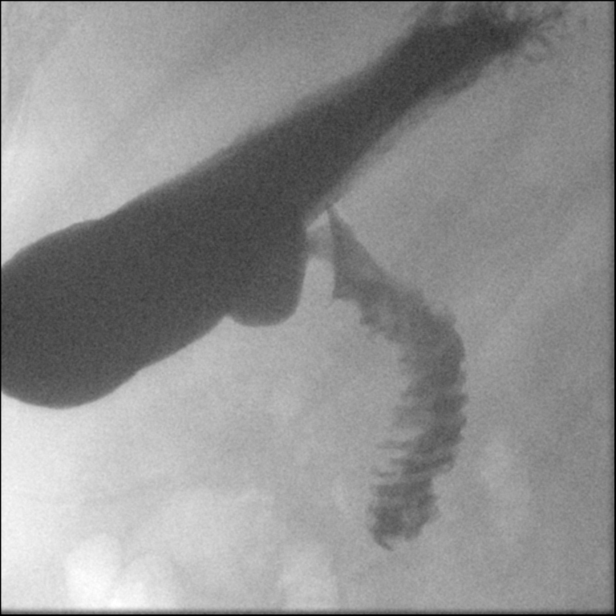

[Series 12: cp_standard · 0.18mm/px · 1 of 1 slices shown (11 of 12)]
[im 1/1]
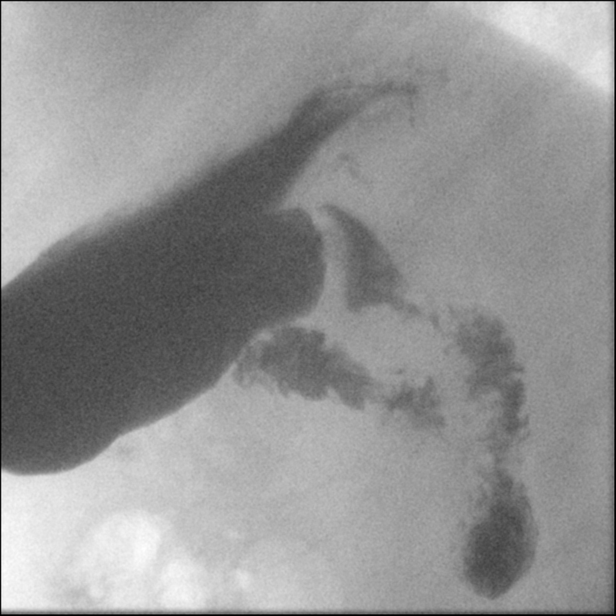

[Series 13: cp_standard · 0.18mm/px · 1 of 1 slices shown (12 of 12)]
[im 1/1]
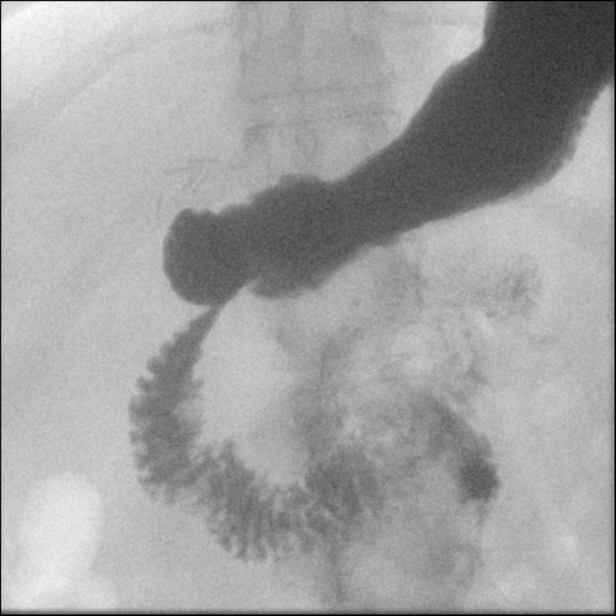

[13 of 13 positions shown; findings below may reference images not displayed]

FINDINGS: Cholecystectomy clips are noted in the right upper quadrant of the
abdomen. The esophagus is patent. No stricture or mass identified.
The motility of the esophagus is normal. A small hiatal hernia
noted. The stomach is otherwise normal in configuration. The
duodenal bulb and sweep are unremarkable. No reflux identified.
IMPRESSION: 1. Small hiatal hernia identified.

## 2019-04-28 ENCOUNTER — Encounter: Payer: Self-pay | Admitting: Podiatry

## 2019-04-28 ENCOUNTER — Ambulatory Visit: Payer: BC Managed Care – PPO | Admitting: Podiatry

## 2019-04-28 ENCOUNTER — Ambulatory Visit (INDEPENDENT_AMBULATORY_CARE_PROVIDER_SITE_OTHER): Payer: BC Managed Care – PPO

## 2019-04-28 ENCOUNTER — Other Ambulatory Visit: Payer: Self-pay

## 2019-04-28 VITALS — Temp 98.1°F

## 2019-04-28 DIAGNOSIS — M205X1 Other deformities of toe(s) (acquired), right foot: Secondary | ICD-10-CM | POA: Diagnosis not present

## 2019-04-28 NOTE — Patient Instructions (Signed)
Pre-Operative Instructions  Congratulations, you have decided to take an important step towards improving your quality of life.  You can be assured that the doctors and staff at [facility] will be with you every step of the way.  Here are some important things you should know:  1. Plan to be at the surgery center/hospital at least 1 (one) hour prior to your scheduled time, unless otherwise directed by the surgical center/hospital staff.  You must have a responsible adult accompany you, remain during the surgery and drive you home.  Make sure you have directions to the surgical center/hospital to ensure you arrive on time. 2. If you are having surgery at [facility] or [facility] hospitals, you will need a copy of your medical history and physical form from your family physician within one month prior to the date of surgery. We will give you a form for your primary physician to complete.  3. We make every effort to accommodate the date you request for surgery.  However, there are times where surgery dates or times have to be moved.  We will contact you as soon as possible if a change in schedule is required.   4. No aspirin/ibuprofen for one week before surgery.  If you are on aspirin, any non-steroidal anti-inflammatory medications (Mobic, Aleve, Ibuprofen) should not be taken seven (7) days prior to your surgery.  You make take Tylenol for pain prior to surgery.  5. Medications - If you are taking daily heart and blood pressure medications, seizure, reflux, allergy, asthma, anxiety, pain or diabetes medications, make sure you notify the surgery center/hospital before the day of surgery so they can tell you which medications you should take or avoid the day of surgery. 6. No food or drink after midnight the night before surgery unless directed otherwise by surgical center/hospital staff. 7. No alcoholic beverages 24-hours prior to surgery.  No smoking 24-hours prior or 24-hours after  surgery. 8. Wear loose pants or shorts. They should be loose enough to fit over bandages, boots, and casts. 9. Don't wear slip-on shoes. Sneakers are preferred. 10. Bring your boot with you to the surgery center/hospital.  Also bring crutches or a walker if your physician has prescribed it for you.  If you do not have this equipment, it will be provided for you after surgery. 11. If you have not been contacted by the surgery center/hospital by the day before your surgery, call to confirm the date and time of your surgery. 12. Leave-time from work may vary depending on the type of surgery you have.  Appropriate arrangements should be made prior to surgery with your employer. 13. Prescriptions will be provided immediately following surgery by your doctor.  Fill these as soon as possible after surgery and take the medication as directed. Pain medications will not be refilled on weekends and must be approved by the doctor. 14. Remove nail polish on the operative foot and avoid getting pedicures prior to surgery. 15. Wash the night before surgery.  The night before surgery wash the foot and leg well with water and the antibacterial soap provided. Be sure to pay special attention to beneath the toenails and in between the toes.  Wash for at least three (3) minutes. Rinse thoroughly with water and dry well with a towel.  Perform this wash unless told not to do so by your physician.  Enclosed: 1 Ice pack (please put in freezer the night before surgery)   1 Hibiclens skin cleaner     Pre-op instructions  If you have any questions regarding the instructions, please do not hesitate to call our office.  [place]: [address], [place], [place] [address] -- [phone]  [place]: [address] [place], [place] [address] -- [phone]  [place]: [address]  [place], [place] [address] -- [phone]  [place]: [address], [place],  [address] -- [phone]  Website:  [email or web address] 

## 2019-04-30 NOTE — Progress Notes (Signed)
   HPI: 58 y.o. female presenting today with a chief complaint of a hallux limitus flare up of the right great toe that began a few months ago. She reports the pain is worsening and becoming more constant. Walking for a long period of time increases the pain. She has been taking OTC pain medication with no significant relief. Patient is here for further evaluation and treatment.   Past Medical History:  Diagnosis Date  . Back pain    SPONDYLOLISTHESIS  . Complication of anesthesia   . Gallstone    one large gallstone - problems off and on with abd pain right sided, nausea  . GERD (gastroesophageal reflux disease)   . Headache(784.0)    migraine  with aura - "classic migraine equivalancy"loss of vision, left face goes numb - but no headache-first one was in 5th grade  . Herpes    non active  . Hypothyroidism    hasimotos  . Pain    neck- bulging disc in neck  . PONV (postoperative nausea and vomiting)    occassional N&V, Zofran did not work well for her for past surgeries  . Poor venous access    PT STATES HER VEINS VERY SMALL AND DIFFICULT TO FIND  . Seasonal allergies   . Vitamin B12 deficiency      Physical Exam: General: The patient is alert and oriented x3 in no acute distress.  Dermatology: Skin is warm, dry and supple bilateral lower extremities. Negative for open lesions or macerations.  Vascular: Palpable pedal pulses bilaterally. No edema or erythema noted. Capillary refill within normal limits.  Neurological: Epicritic and protective threshold grossly intact bilaterally.   Musculoskeletal Exam: Pain on palpation with limited range of motion noted to the first MPJ right foot.  Radiographic Exam: Degenerative changes noted with joint space narrowing first MPJ. There also appears to be extra-articular spurring noted about the joint.    Assessment: 1. Hallux limitus right 2. Pain in right foot    Plan of Care:  1. Patient evaluated. X-Rays reviewed.  2. Today we  discussed the conservative versus surgical management of the presenting pathology. The patient opts for surgical management. All possible complications and details of the procedure were explained. All patient questions were answered. No guarantees were expressed or implied. 3. Authorization for surgery was initiated today. Surgery will consist of right great toe arthroplasty with silastic implant.  4. Return to clinic one week post op.   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      Edrick Kins, DPM [facility]  Dr. Edrick Kins, DPM    [address], [place] [address]                Office [phone]  Fax [phone]

## 2019-05-26 ENCOUNTER — Telehealth: Payer: Self-pay | Admitting: *Deleted

## 2019-05-26 NOTE — Telephone Encounter (Signed)
"  I have a surgery appointment with Dr. Amalia Hailey on September 3.  I need to postpone that.  If you could, call back to confirm.  I'm not sure when I'm going to be able to do this surgery.  So, I'm going to have to not reschedule at this time."

## 2019-05-28 NOTE — Telephone Encounter (Signed)
I attempted to return her call.  I could not leave a message.  The phone rung continuously.   I canceled her surgery that was scheduled for June 12, 2019 via the surgical center's [facility].

## 2019-05-29 ENCOUNTER — Telehealth: Payer: Self-pay | Admitting: *Deleted

## 2019-05-29 NOTE — Telephone Encounter (Signed)
"  I haven't had any confirmation about whether you or not you were able to cancel my surgery for 9/3.  I just want to make sure you did.  I was receiving some emails from the surgical center and [facility] indicating that it was still happening.  So I want to make sure it's off the books for the moment."

## 2019-05-30 NOTE — Telephone Encounter (Signed)
I attempted to call her back.  I left her a message that I have canceled her surgery date.  I asked her to call me if she has any questions or if she wanted to reschedule her surgery date.

## 2019-06-05 ENCOUNTER — Other Ambulatory Visit: Payer: Self-pay

## 2019-06-05 DIAGNOSIS — Z20822 Contact with and (suspected) exposure to covid-19: Secondary | ICD-10-CM

## 2019-06-07 LAB — NOVEL CORONAVIRUS, NAA: SARS-CoV-2, NAA: NOT DETECTED

## 2019-09-24 ENCOUNTER — Telehealth: Payer: Self-pay | Admitting: *Deleted

## 2019-09-24 NOTE — Telephone Encounter (Signed)
"  I'm a patient of Dr. Amalia Hailey.  I need to get my surgery rescheduled.  I had canceled but need to go ahead and reschedule it now.  Please feel free to call me anytime."

## 2019-09-25 NOTE — Telephone Encounter (Signed)
"  I need to reschedule my surgery date with Dr. Amalia Hailey.  If you could, give me a call back."

## 2019-09-30 ENCOUNTER — Ambulatory Visit (INDEPENDENT_AMBULATORY_CARE_PROVIDER_SITE_OTHER): Payer: BC Managed Care – PPO | Admitting: Otolaryngology

## 2019-09-30 NOTE — Telephone Encounter (Signed)
I'm returning your call.  I apologize for the delay.  "That's alright."  You want to reschedule your surgery.  What date are you scheduled?  "I don't have a date scheduled.  I did but I had canceled it due to Covid."  Dr. Amalia Hailey' next available date is not until December 04, 2019.  "That far out, he's busy!"  Yes, he's been very busy.  "Go ahead and put me down for then."  I'll get it scheduled.  However, you need to see Dr. Amalia Hailey again for a consultation.  He last saw you in July.  I can schedule that appointment for you if you would like to schedule it now.  "Okay, that is fine."  He can see you on October 26, 2018.  "I can't do it on that date."  He can do it the following week on January 25 at 10:45 am.  "That's perfect."

## 2019-11-03 ENCOUNTER — Ambulatory Visit: Payer: BC Managed Care – PPO | Admitting: Podiatry

## 2019-11-03 ENCOUNTER — Other Ambulatory Visit: Payer: Self-pay

## 2019-11-03 DIAGNOSIS — M205X1 Other deformities of toe(s) (acquired), right foot: Secondary | ICD-10-CM

## 2019-11-03 DIAGNOSIS — M722 Plantar fascial fibromatosis: Secondary | ICD-10-CM

## 2019-11-03 MED ORDER — MELOXICAM 15 MG PO TABS
15.0000 mg | ORAL_TABLET | Freq: Every day | ORAL | 1 refills | Status: DC
Start: 2019-11-03 — End: 2019-12-30

## 2019-11-03 MED ORDER — METHYLPREDNISOLONE 4 MG PO TBPK: ORAL_TABLET | ORAL | 0 refills | Status: DC

## 2019-11-05 NOTE — Progress Notes (Signed)
   HPI: 59 y.o. female presenting today for follow up evaluation of hallux limitus of the right foot. She is scheduled for surgery on 12/04/2019 which will consist right great toe arthroplasty with silastic implant.  She also reports a new complaint of right plantar and posterior heel pain that began about 5 months ago. She states the pain is intermittent and worsens with walking and being on the foot excessively. She has been using bandages specifically for foot support and wearing tennis shoes for treatment. Patient is here for further evaluation and treatment.   Past Medical History:  Diagnosis Date  . Back pain    SPONDYLOLISTHESIS  . Complication of anesthesia   . Gallstone    one large gallstone - problems off and on with abd pain right sided, nausea  . GERD (gastroesophageal reflux disease)   . Headache(784.0)    migraine  with aura - "classic migraine equivalancy"loss of vision, left face goes numb - but no headache-first one was in 5th grade  . Herpes    non active  . Hypothyroidism    hasimotos  . Pain    neck- bulging disc in neck  . PONV (postoperative nausea and vomiting)    occassional N&V, Zofran did not work well for her for past surgeries  . Poor venous access    PT STATES HER VEINS VERY SMALL AND DIFFICULT TO FIND  . Seasonal allergies   . Vitamin B12 deficiency      Physical Exam: General: The patient is alert and oriented x3 in no acute distress.  Dermatology: Skin is warm, dry and supple bilateral lower extremities. Negative for open lesions or macerations.  Vascular: Palpable pedal pulses bilaterally. No edema or erythema noted. Capillary refill within normal limits.  Neurological: Epicritic and protective threshold grossly intact bilaterally.   Musculoskeletal Exam: Pain on palpation with limited range of motion noted to the first MPJ right foot. Pain with palpation also noted to the right heel along the plantar fascia.    Assessment: 1. Hallux limitus  right 2. Plantar fasciitis right foot    Plan of Care:  1. Patient evaluated.  2. Declined injections.  3. Prescription for Medrol Dose Pak provided to patient. 4. Prescription for Meloxicam provided to patient. 5. Patient scheduled for surgery on 12/04/2019. Surgery will consist of right great toe arthroplasty with silastic implant.  6. CAM boot dispensed.  7. Return to clinic one week post op.   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      Felecia Shelling, DPM [facility]  Dr. Felecia Shelling, DPM    [address] [place], [place] [address]                Office [phone]  Fax [phone]

## 2019-11-28 ENCOUNTER — Telehealth: Payer: Self-pay

## 2019-11-28 NOTE — Telephone Encounter (Signed)
DOS 12/04/19 [facility] - [id number]     In-Network        Co Insurance - 20%      Deductible $1250.00 REMAINING $1250.00     Out-Of-Pocket - $4890.00 REMAINING $2130.86 COPAY - NOT APPLICABLE  SPOKE TO ANTHONY J AT [employer] [address] (CALL REF # [id number]  HE STATED NO AUTH NEEDED FOR PROCEDURE [phone]

## 2019-12-04 ENCOUNTER — Encounter: Payer: Self-pay | Admitting: Podiatry

## 2019-12-04 ENCOUNTER — Other Ambulatory Visit: Payer: Self-pay | Admitting: Podiatry

## 2019-12-04 DIAGNOSIS — M2021 Hallux rigidus, right foot: Secondary | ICD-10-CM | POA: Diagnosis not present

## 2019-12-04 MED ORDER — PROMETHAZINE HCL 12.5 MG PO TABS: 12.5000 mg | ORAL_TABLET | Freq: Four times a day (QID) | ORAL | 0 refills | Status: DC | PRN

## 2019-12-04 MED ORDER — OXYCODONE-ACETAMINOPHEN 5-325 MG PO TABS: 1.0000 | ORAL_TABLET | Freq: Four times a day (QID) | ORAL | 0 refills | Status: DC | PRN

## 2019-12-04 NOTE — Progress Notes (Signed)
PRN postop 

## 2019-12-10 ENCOUNTER — Other Ambulatory Visit: Payer: Self-pay

## 2019-12-10 ENCOUNTER — Ambulatory Visit (INDEPENDENT_AMBULATORY_CARE_PROVIDER_SITE_OTHER): Payer: BC Managed Care – PPO

## 2019-12-10 ENCOUNTER — Encounter: Payer: Self-pay | Admitting: Podiatry

## 2019-12-10 ENCOUNTER — Ambulatory Visit (INDEPENDENT_AMBULATORY_CARE_PROVIDER_SITE_OTHER): Payer: BC Managed Care – PPO | Admitting: Podiatry

## 2019-12-10 VITALS — BP 138/82 | HR 96 | Temp 97.5°F

## 2019-12-10 DIAGNOSIS — M205X1 Other deformities of toe(s) (acquired), right foot: Secondary | ICD-10-CM | POA: Diagnosis not present

## 2019-12-10 DIAGNOSIS — Z9889 Other specified postprocedural states: Secondary | ICD-10-CM

## 2019-12-11 ENCOUNTER — Ambulatory Visit: Payer: Self-pay

## 2019-12-11 ENCOUNTER — Ambulatory Visit: Payer: BC Managed Care – PPO | Attending: Internal Medicine

## 2019-12-11 DIAGNOSIS — Z23 Encounter for immunization: Secondary | ICD-10-CM | POA: Insufficient documentation

## 2019-12-11 NOTE — Progress Notes (Signed)
   Covid-19 Vaccination Clinic  Name:  UMEKA WRENCH    MRN: [id number] DOB: Sep 20, 1961  12/11/2019  Ms. Truxillo was observed post Covid-19 immunization for 15 minutes without incident. She was provided with Vaccine Information Sheet and instruction to access the V-Safe system.   Ms. Brede was instructed to call 911 with any severe reactions post vaccine: Marland Kitchen Difficulty breathing  . Swelling of face and throat  . A fast heartbeat  . A bad rash all over body  . Dizziness and weakness   Immunizations Administered    Name Date Dose VIS Date Route   Pfizer COVID-19 Vaccine 12/11/2019  6:30 PM 0.3 mL 09/19/2019 Intramuscular   Manufacturer: [employer], [employer]   Lot: [id number]   NDC: [address]-8

## 2019-12-13 NOTE — Progress Notes (Signed)
   Subjective:  Patient presents today status post 1st MPJ implant right. DOS: 12/04/2019. She states she is doing well. She reports taking Meloxicam for pain which controls it well. She has been using the CAM boot as directed. There are no worsening factors noted. Patient is here for further evaluation and treatment.    Past Medical History:  Diagnosis Date  . Back pain    SPONDYLOLISTHESIS  . Complication of anesthesia   . Gallstone    one large gallstone - problems off and on with abd pain right sided, nausea  . GERD (gastroesophageal reflux disease)   . Headache(784.0)    migraine  with aura - "classic migraine equivalancy"loss of vision, left face goes numb - but no headache-first one was in 5th grade  . Herpes    non active  . Hypothyroidism    hasimotos  . Pain    neck- bulging disc in neck  . PONV (postoperative nausea and vomiting)    occassional N&V, Zofran did not work well for her for past surgeries  . Poor venous access    PT STATES HER VEINS VERY SMALL AND DIFFICULT TO FIND  . Seasonal allergies   . Vitamin B12 deficiency       Objective/Physical Exam Neurovascular status intact.  Skin incisions appear to be well coapted with sutures and staples intact. No sign of infectious process noted. No dehiscence. No active bleeding noted. Moderate edema noted to the surgical extremity.  Radiographic Exam:  Orthopedic hardware and osteotomies sites appear to be stable with routine healing.  Assessment: 1. s/p 1st MPJ implant right. DOS: 12/04/2019   Plan of Care:  1. Patient was evaluated. X-rays reviewed 2. Dressing changed.  3. Continue weightbearing in CAM boot.  4. Return to clinic in one week.   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    Felecia Shelling, DPM [facility]  Dr. Felecia Shelling, DPM    [address]                                        [place], [place] [address]                Office [phone]  Fax [phone]

## 2019-12-14 ENCOUNTER — Ambulatory Visit: Payer: Self-pay

## 2019-12-22 ENCOUNTER — Ambulatory Visit (INDEPENDENT_AMBULATORY_CARE_PROVIDER_SITE_OTHER): Payer: BC Managed Care – PPO

## 2019-12-22 ENCOUNTER — Other Ambulatory Visit: Payer: Self-pay

## 2019-12-22 ENCOUNTER — Ambulatory Visit (INDEPENDENT_AMBULATORY_CARE_PROVIDER_SITE_OTHER): Payer: BC Managed Care – PPO | Admitting: Podiatry

## 2019-12-22 VITALS — Temp 96.9°F

## 2019-12-22 DIAGNOSIS — Z9889 Other specified postprocedural states: Secondary | ICD-10-CM

## 2019-12-22 DIAGNOSIS — M205X1 Other deformities of toe(s) (acquired), right foot: Secondary | ICD-10-CM

## 2019-12-24 NOTE — Progress Notes (Signed)
   Subjective:  Patient presents today status post 1st MPJ implant right. DOS: 12/04/2019. She states she is doing well. She denies any significant pain or modifying factors. She has been using the post op shoe as directed. Patient is here for further evaluation and treatment.    Past Medical History:  Diagnosis Date  . Back pain    SPONDYLOLISTHESIS  . Complication of anesthesia   . Gallstone    one large gallstone - problems off and on with abd pain right sided, nausea  . GERD (gastroesophageal reflux disease)   . Headache(784.0)    migraine  with aura - "classic migraine equivalancy"loss of vision, left face goes numb - but no headache-first one was in 5th grade  . Herpes    non active  . Hypothyroidism    hasimotos  . Pain    neck- bulging disc in neck  . PONV (postoperative nausea and vomiting)    occassional N&V, Zofran did not work well for her for past surgeries  . Poor venous access    PT STATES HER VEINS VERY SMALL AND DIFFICULT TO FIND  . Seasonal allergies   . Vitamin B12 deficiency       Objective/Physical Exam Neurovascular status intact.  Skin incisions appear to be well coapted. No sign of infectious process noted. No dehiscence. No active bleeding noted. Moderate edema noted to the surgical extremity.  Radiographic Exam:  Orthopedic hardware and osteotomies sites appear to be stable with routine healing.  Assessment: 1. s/p 1st MPJ implant right. DOS: 12/04/2019   Plan of Care:  1. Patient was evaluated. X-rays reviewed 2. Sutures removed.  3. Discontinue using post op shoe.  4. Recommended good shoe gear.  5. Return to clinic in 4 weeks for final follow up X-Ray.   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    Felecia Shelling, DPM [facility]  Dr. Felecia Shelling, DPM    [address]                                        [place], [place] [address]                Office [phone]  Fax [phone]

## 2019-12-27 ENCOUNTER — Other Ambulatory Visit: Payer: Self-pay | Admitting: Podiatry

## 2019-12-30 NOTE — Telephone Encounter (Signed)
Rx Meloxicam sent to [facility] [address]

## 2020-01-05 ENCOUNTER — Encounter: Payer: BC Managed Care – PPO | Admitting: Podiatry

## 2020-01-07 ENCOUNTER — Ambulatory Visit: Payer: BC Managed Care – PPO | Attending: Internal Medicine

## 2020-01-07 DIAGNOSIS — Z23 Encounter for immunization: Secondary | ICD-10-CM

## 2020-01-07 NOTE — Progress Notes (Signed)
   Covid-19 Vaccination Clinic  Name:  NIKERIA KALMAN    MRN: [id number] DOB: 1961-05-07  01/07/2020  Ms. Medico was observed post Covid-19 immunization for 15 minutes without incident. She was provided with Vaccine Information Sheet and instruction to access the V-Safe system.   Ms. Much was instructed to call 911 with any severe reactions post vaccine: Marland Kitchen Difficulty breathing  . Swelling of face and throat  . A fast heartbeat  . A bad rash all over body  . Dizziness and weakness   Immunizations Administered    Name Date Dose VIS Date Route   Pfizer COVID-19 Vaccine 01/07/2020  4:19 PM 0.3 mL 09/19/2019 Intramuscular   Manufacturer: [employer], [employer]   Lot: [id number]   NDC: [address]-8

## 2020-01-19 ENCOUNTER — Ambulatory Visit (INDEPENDENT_AMBULATORY_CARE_PROVIDER_SITE_OTHER): Payer: BC Managed Care – PPO

## 2020-01-19 ENCOUNTER — Other Ambulatory Visit: Payer: Self-pay

## 2020-01-19 ENCOUNTER — Ambulatory Visit (INDEPENDENT_AMBULATORY_CARE_PROVIDER_SITE_OTHER): Payer: BC Managed Care – PPO | Admitting: Podiatry

## 2020-01-19 DIAGNOSIS — M205X1 Other deformities of toe(s) (acquired), right foot: Secondary | ICD-10-CM

## 2020-01-19 DIAGNOSIS — Z9889 Other specified postprocedural states: Secondary | ICD-10-CM

## 2020-01-25 NOTE — Progress Notes (Signed)
   Subjective:  Patient presents today status post 1st MPJ implant right. DOS: 12/04/2019. She reports some continued soreness of the right plantar forefoot.. She has been wearing regular shoes which she states helps alleviate the symptoms. There are no worsening factors noted. Patient is here for further evaluation and treatment.    Past Medical History:  Diagnosis Date  . Back pain    SPONDYLOLISTHESIS  . Complication of anesthesia   . Gallstone    one large gallstone - problems off and on with abd pain right sided, nausea  . GERD (gastroesophageal reflux disease)   . Headache(784.0)    migraine  with aura - "classic migraine equivalancy"loss of vision, left face goes numb - but no headache-first one was in 5th grade  . Herpes    non active  . Hypothyroidism    hasimotos  . Pain    neck- bulging disc in neck  . PONV (postoperative nausea and vomiting)    occassional N&V, Zofran did not work well for her for past surgeries  . Poor venous access    PT STATES HER VEINS VERY SMALL AND DIFFICULT TO FIND  . Seasonal allergies   . Vitamin B12 deficiency       Objective/Physical Exam Neurovascular status intact.  Skin incisions appear to be well coapted. No sign of infectious process noted. No dehiscence. No active bleeding noted. Moderate edema noted to the surgical extremity.  Radiographic Exam:  Orthopedic hardware and osteotomies sites appear to be stable with routine healing.  Assessment: 1. s/p 1st MPJ implant right. DOS: 12/04/2019   Plan of Care:  1. Patient was evaluated. X-rays reviewed 2. Recommended good shoe gear.  3. Physical therapy ordered from [facility].  4. Return to clinic in 6 weeks.    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    Felecia Shelling, DPM [facility]  Dr. Felecia Shelling, DPM    [address]                                        [place], [place] [address]                Office [phone]  Fax [phone]

## 2020-02-23 ENCOUNTER — Other Ambulatory Visit: Payer: Self-pay

## 2020-02-23 ENCOUNTER — Encounter: Payer: Self-pay | Admitting: Podiatry

## 2020-02-23 ENCOUNTER — Ambulatory Visit (INDEPENDENT_AMBULATORY_CARE_PROVIDER_SITE_OTHER): Payer: BC Managed Care – PPO

## 2020-02-23 ENCOUNTER — Ambulatory Visit (INDEPENDENT_AMBULATORY_CARE_PROVIDER_SITE_OTHER): Payer: BC Managed Care – PPO | Admitting: Podiatry

## 2020-02-23 DIAGNOSIS — M205X1 Other deformities of toe(s) (acquired), right foot: Secondary | ICD-10-CM | POA: Diagnosis not present

## 2020-02-23 DIAGNOSIS — Z9889 Other specified postprocedural states: Secondary | ICD-10-CM | POA: Diagnosis not present

## 2020-02-23 MED ORDER — METHYLPREDNISOLONE 4 MG PO TBPK: ORAL_TABLET | ORAL | 0 refills | Status: DC

## 2020-02-23 NOTE — Progress Notes (Signed)
   Subjective:  Patient presents today status post 1st MPJ implant right. DOS: 12/04/2019.  Patient continues to report tenderness with range of motion and chronic swelling to the toe.  She has been wearing good supportive shoes and going to physical therapy to increase her range of motion.    Past Medical History:  Diagnosis Date  . Back pain    SPONDYLOLISTHESIS  . Complication of anesthesia   . Gallstone    one large gallstone - problems off and on with abd pain right sided, nausea  . GERD (gastroesophageal reflux disease)   . Headache(784.0)    migraine  with aura - "classic migraine equivalancy"loss of vision, left face goes numb - but no headache-first one was in 5th grade  . Herpes    non active  . Hypothyroidism    hasimotos  . Pain    neck- bulging disc in neck  . PONV (postoperative nausea and vomiting)    occassional N&V, Zofran did not work well for her for past surgeries  . Poor venous access    PT STATES HER VEINS VERY SMALL AND DIFFICULT TO FIND  . Seasonal allergies   . Vitamin B12 deficiency       Objective/Physical Exam Neurovascular status intact.  Skin incisions appear to be well coapted and healed.  There continues to be chronic edema to the first MPJ locally around the area of the implant.  Pain with dorsiflexion noted.  Radiographic Exam:  Silastic implant appears to be stable, however there does appear to be some erosion of the osteotomy site at the distal head of the first metatarsal.  Assessment: 1. s/p 1st MPJ implant right. DOS: 12/04/2019  Plan of Care:  1. Patient was evaluated. X-rays reviewed 2. Recommended good shoe gear.  3.  Patient states she is not getting much benefit from the physical therapy.  She can go ahead and discontinue physical therapy 4.  Prescription for Medrol Dosepak, then continue meloxicam daily 5.  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    Felecia Shelling, DPM [facility]  Dr. Felecia Shelling, DPM    [address]                                        [place], [place] [address]                Office [phone]  Fax [phone]

## 2020-06-01 ENCOUNTER — Ambulatory Visit: Payer: BC Managed Care – PPO | Attending: Internal Medicine

## 2020-06-01 DIAGNOSIS — Z23 Encounter for immunization: Secondary | ICD-10-CM

## 2020-06-01 NOTE — Progress Notes (Signed)
   Covid-19 Vaccination Clinic  Name:  ZAINAH STEVEN    MRN: [id number] DOB: May 01, 1961  06/01/2020  Ms. Montanaro was observed post Covid-19 immunization for 30 minutes based on pre-vaccination screening without incident. She was provided with Vaccine Information Sheet and instruction to access the V-Safe system.   Ms. Vicencio was instructed to call 911 with any severe reactions post vaccine: Marland Kitchen Difficulty breathing  . Swelling of face and throat  . A fast heartbeat  . A bad rash all over body  . Dizziness and weakness

## 2020-06-02 ENCOUNTER — Ambulatory Visit: Payer: BC Managed Care – PPO | Admitting: Podiatry

## 2020-06-02 ENCOUNTER — Other Ambulatory Visit: Payer: Self-pay

## 2020-06-02 ENCOUNTER — Ambulatory Visit (INDEPENDENT_AMBULATORY_CARE_PROVIDER_SITE_OTHER): Payer: BC Managed Care – PPO

## 2020-06-02 DIAGNOSIS — Z9889 Other specified postprocedural states: Secondary | ICD-10-CM

## 2020-06-02 DIAGNOSIS — M722 Plantar fascial fibromatosis: Secondary | ICD-10-CM

## 2020-06-02 DIAGNOSIS — M205X1 Other deformities of toe(s) (acquired), right foot: Secondary | ICD-10-CM

## 2020-06-02 NOTE — Progress Notes (Signed)
   Subjective:  Patient presents today status post 1st MPJ implant right. DOS: 12/04/2019.  Patient states that over the past 3 months there has been some improvement.  She continues to have stiffness to the toe joint and it feels like the great toes constricted, however she is able to ambulate decently.  She has to be very particular with certain shoes.  No new complaints at this time.    Past Medical History:  Diagnosis Date  . Back pain    SPONDYLOLISTHESIS  . Complication of anesthesia   . Gallstone    one large gallstone - problems off and on with abd pain right sided, nausea  . GERD (gastroesophageal reflux disease)   . Headache(784.0)    migraine  with aura - "classic migraine equivalancy"loss of vision, left face goes numb - but no headache-first one was in 5th grade  . Herpes    non active  . Hypothyroidism    hasimotos  . Pain    neck- bulging disc in neck  . PONV (postoperative nausea and vomiting)    occassional N&V, Zofran did not work well for her for past surgeries  . Poor venous access    PT STATES HER VEINS VERY SMALL AND DIFFICULT TO FIND  . Seasonal allergies   . Vitamin B12 deficiency       Objective/Physical Exam Neurovascular status intact.  Skin incisions appear to be well coapted and healed.  Negative for any edema around the first MTPJ.  There is some pain to palpation at the sesamoidal apparatus of the first MTPJ right foot.  Range of motion to the first MTPJ is actually decent and mostly within normal limits.  Radiographic Exam:  Silastic implant appears to be stable no significant change since last visit and prior radiographic exam  Assessment: 1. s/p 1st MPJ implant right. DOS: 12/04/2019  Plan of Care:  1. Patient was evaluated. X-rays reviewed 2. Recommended good shoe gear.  3.  Patient may resume full activity no restriction 4.  Continue daily range of motion exercises 5.  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    Felecia Shelling, DPM [facility]  Dr. Felecia Shelling, DPM    [address]                                        [place], [place] [address]                Office [phone]  Fax [phone]

## 2021-05-26 ENCOUNTER — Ambulatory Visit: Payer: BC Managed Care – PPO | Admitting: Internal Medicine

## 2021-05-26 ENCOUNTER — Other Ambulatory Visit: Payer: Self-pay

## 2021-05-26 ENCOUNTER — Encounter: Payer: Self-pay | Admitting: Internal Medicine

## 2021-05-26 VITALS — BP 138/93 | HR 78 | Ht 65.0 in | Wt 214.0 lb

## 2021-05-26 DIAGNOSIS — R072 Precordial pain: Secondary | ICD-10-CM | POA: Diagnosis not present

## 2021-05-26 DIAGNOSIS — Z6841 Body Mass Index (BMI) 40.0 and over, adult: Secondary | ICD-10-CM | POA: Diagnosis not present

## 2021-05-26 DIAGNOSIS — E782 Mixed hyperlipidemia: Secondary | ICD-10-CM | POA: Diagnosis not present

## 2021-05-26 MED ORDER — METOPROLOL TARTRATE 100 MG PO TABS: ORAL_TABLET | ORAL | 0 refills | Status: DC

## 2021-05-26 NOTE — Patient Instructions (Signed)
Medication Instructions:  Your physician recommends that you continue on your current medications as directed. Please refer to the Current Medication list given to you today.  *If you need a refill on your cardiac medications before your next appointment, please call your pharmacy*   Lab Work: BMET If you have labs (blood work) drawn today and your tests are completely normal, you will receive your results only by: MyChart Message (if you have MyChart) OR A paper copy in the mail If you have any lab test that is abnormal or we need to change your treatment, we will call you to review the results.   Testing/Procedures: Your physician has requested that you have cardiac CT. Cardiac computed tomography (CT) is a painless test that uses an x-ray machine to take clear, detailed pictures of your heart. For further information please visit [email or web address] Please follow instruction sheet as given.     Follow-Up: At [facility], you and your health needs are our priority.  As part of our continuing mission to provide you with exceptional heart care, we have created designated Provider Care Teams.  These Care Teams include your primary Cardiologist (physician) and Advanced Practice Providers (APPs -  Physician Assistants and Nurse Practitioners) who all work together to provide you with the care you need, when you need it.   Your next appointment:   3 month(s)  The format for your next appointment:   In Person  Provider:   You may see Riley Lam, MD or one of the following Advanced Practice Providers on your designated Care Team:   Ronie Spies, PA-C Jacolyn Reedy, PA-C   Other Instructions   Your cardiac CT will be scheduled at one of the below locations:   [facility] [address] [place], [place] [address] [phone]   At [facility], please arrive at the [facility] main entrance (entrance A) of [facility] 30 minutes prior to  test start time. Proceed to the [facility] Radiology Department (first floor) to check-in and test prep.   Please follow these instructions carefully (unless otherwise directed):   On the Night Before the Test: Be sure to Drink plenty of water. Do not consume any caffeinated/decaffeinated beverages or chocolate 12 hours prior to your test. Do not take any antihistamines (flonase and loratadine)  12 hours prior to your test.    On the Day of the Test: Drink plenty of water until 1 hour prior to the test. Do not eat any food 4 hours prior to the test. You may take your regular medications prior to the test.  Take metoprolol (Lopressor) 100 mg two hours prior to test. FEMALES- please wear underwire-free bra if available, avoid dresses & tight clothing   After the Test: Drink plenty of water. After receiving IV contrast, you may experience a mild flushed feeling. This is normal. On occasion, you may experience a mild rash up to 24 hours after the test. This is not dangerous. If this occurs, you can take Benadryl 25 mg and increase your fluid intake. If you experience trouble breathing, this can be serious. If it is severe call 911 IMMEDIATELY. If it is mild, please call our office. If you take any of these medications: Glipizide/Metformin, Avandament, Glucavance, please do not take 48 hours after completing test unless otherwise instructed.  Please allow 2-4 weeks for scheduling of routine cardiac CTs. Some insurance companies require a pre-authorization which may delay scheduling of this test.   For non-scheduling related  questions, please contact the cardiac imaging nurse navigator should you have any questions/concerns: Rockwell Alexandria, Cardiac Imaging Nurse Navigator Larey Brick, Cardiac Imaging Nurse Navigator  [facility] Direct Office Dial: [phone]   For scheduling needs, including cancellations and rescheduling, please call [place],  [phone].

## 2021-05-26 NOTE — Progress Notes (Signed)
Cardiology Office Note:    Date:  05/26/2021   ID:  Bonnie Schwartz, DOB Nov 24, 1960, MRN [id number]  PCP:  Elias Else, MD   [facility] Providers Cardiologist:  Christell Constant, MD     CC: Chest pain Consulted for the evaluation of needing Sestamibi at the behest of Elias Else, MD  History of Present Illness:    Bonnie Schwartz is a 60 y.o. female with a hx of Morbid Obesity, HTN, and HLD who presents for evaluation 05/26/21.  Patient notes that she is feeling of squeezing chest pain. This happens sporadically.  Also has chest tightness that felt this in the back.  No improvement with GERD therapy.  Occurs spontaneously. Patient exertion notable for taking the stairs daily and does not bring on leg heaviness (always have R left heaviness) and does not have chest tightness or squeezing; but does have DOE.  No shortness of breath at rest..  No PND or orthopnea.  No weight gain, leg swelling , or abdominal swelling.  No syncope or near syncope- thought does have dizziness related to balance. Notes that she has always have palpitations that were worse prior to being treating for thyroid disease.  No history of pre-eclampsia, gestation HTN or gestational DM.    Past Medical History:  Diagnosis Date   Back pain    SPONDYLOLISTHESIS   Complication of anesthesia    Gallstone    one large gallstone - problems off and on with abd pain right sided, nausea   GERD (gastroesophageal reflux disease)    Headache(784.0)    migraine  with aura - "classic migraine equivalancy"loss of vision, left face goes numb - but no headache-first one was in 5th grade   Herpes    non active   Hypothyroidism    hasimotos   Pain    neck- bulging disc in neck   PONV (postoperative nausea and vomiting)    occassional N&V, Zofran did not work well for her for past surgeries   Poor venous access    PT STATES HER VEINS VERY SMALL AND DIFFICULT TO FIND   Seasonal allergies    Vitamin B12  deficiency     Past Surgical History:  Procedure Laterality Date   CHOLECYSTECTOMY N/A 06/19/2014   Procedure: LAPAROSCOPIC CHOLECYSTECTOMY WITH INTRAOPERATIVE CHOLANGIOGRAM;  Surgeon: Avel Peace, MD;  Location: [facility];  Service: General;  Laterality: N/A;   ENDOMETRIAL ABLATION     LAPAROSCOPIC GASTRIC SLEEVE RESECTION WITH HIATAL HERNIA REPAIR N/A 04/25/2016   Procedure: LAPAROSCOPIC GASTRIC SLEEVE RESECTION AND UPPER ENDO ;  Surgeon: Glenna Fellows, MD;  Location: [facility];  Service: General;  Laterality: N/A;   PELVIC FLOOR SURGERY     X2   perineal fistula repair     tear duct surgery  10/2015    Current Medications: Current Meds  Medication Sig   buPROPion (WELLBUTRIN XL) 150 MG 24 hr tablet Take 150 mg by mouth every morning.   Calcium Citrate-Vitamin D (CALCIUM CITRATE + PO) Take 1 tablet by mouth daily.   cholecalciferol (VITAMIN D) 1000 UNITS tablet Take 1,000 Units by mouth daily.   CLARAVIS 20 MG capsule Take 20 mg by mouth daily.   cyclobenzaprine (FLEXERIL) 10 MG tablet Take 10 mg by mouth 3 (three) times daily as needed for muscle spasms.   Fish Oil-Cholecalciferol (FISH OIL + D3 PO) Take 1 capsule by mouth daily.    fluticasone (FLONASE) 50 MCG/ACT nasal spray Place 2 sprays into both nostrils as needed.  levothyroxine (SYNTHROID, LEVOTHROID) 112 MCG tablet Take 112 mcg by mouth daily before breakfast.   lisdexamfetamine (VYVANSE) 50 MG capsule Take 50 mg by mouth daily.   Loratadine 10 MG CAPS Claritin   LORazepam (ATIVAN) 1 MG tablet Take 1 mg by mouth at bedtime. Reported on 02/24/2016   magnesium oxide (MAG-OX) 400 MG tablet Take 400 mg by mouth daily.   meloxicam (MOBIC) 15 MG tablet TAKE 1 TABLET BY MOUTH EVERY DAY   metoprolol tartrate (LOPRESSOR) 100 MG tablet Take 2 hours prior to Cardiac CT   Multiple Vitamin (MULTIVITAMIN WITH MINERALS) TABS tablet Take 1 tablet by mouth daily.   omeprazole (PRILOSEC) 20 MG capsule Take 20 mg by mouth daily.    promethazine (PHENERGAN) 12.5 MG tablet Take 1 tablet (12.5 mg total) by mouth every 6 (six) hours as needed for nausea or vomiting.   valACYclovir (VALTREX) 1000 MG tablet Take 1,000 mg by mouth as needed.    vitamin B-12 (CYANOCOBALAMIN) 500 MCG tablet Take 500 mcg by mouth daily.     Allergies:   Minocycline, Morphine and related, Oxycodone, and Erythromycin   Social History   Socioeconomic History   Marital status: Married    Spouse name: Not on file   Number of children: 4   Years of education: Not on file   Highest education level: Associate degree: academic program  Occupational History   Not on file  Tobacco Use   Smoking status: Never   Smokeless tobacco: Never  Vaping Use   Vaping Use: Never used  Substance and Sexual Activity   Alcohol use: No   Drug use: No   Sexual activity: Yes    Birth control/protection: None, Other-see comments    Comment: ablation 2006  Other Topics Concern   Not on file  Social History Narrative   Right handed    Lives at home with spouse   Drinks occasional caffeine   Social Determinants of Health   Financial Resource Strain: Not on file  Food Insecurity: Not on file  Transportation Needs: Not on file  Physical Activity: Not on file  Stress: Not on file  Social Connections: Not on file    Social: Works at [employer]  Family History: The patient's family history includes Breast cancer in her maternal grandmother; Cancer (age of onset: 3650) in her maternal grandmother; Cancer (age of onset: 3160) in her mother; Colon cancer in her maternal grandmother; Dementia in her father; Diabetes in her mother; Hypertension in her mother; Stroke in her father. Sister has PAD, Father has vascular dementia.  ROS:   Please see the history of present illness.     All other systems reviewed and are negative.  EKGs/Labs/Other Studies Reviewed:    The following studies were reviewed today:  EKG:  EKG is  ordered today.  The ekg ordered today  demonstrates  05/26/21: SR rate 78   Recent Labs: No results found for requested labs within last 8760 hours.  Recent Lipid Panel No results found for: CHOL, TRIG, HDL, CHOLHDL, VLDL, LDLCALC, LDLDIRECT  Physical Exam:    VS:  BP (!) 138/93   Pulse 78   Ht 5\' 5"  (1.651 m)   Wt 214 lb (97.1 kg)   SpO2 97%   BMI 35.61 kg/m     Wt Readings from Last 3 Encounters:  05/26/21 214 lb (97.1 kg)  09/24/17 185 lb 6.4 oz (84.1 kg)  07/13/16 214 lb 9.6 oz (97.3 kg)     GEN:  Obese  female, well developed in no acute distress HEENT: Normal NECK: No JVD LYMPHATICS: No lymphadenopathy CARDIAC: RRR, no murmurs, rubs, gallops RESPIRATORY:  Clear to auscultation without rales, wheezing or rhonchi  ABDOMEN: Soft, non-tender, non-distended MUSCULOSKELETAL:  No edema; No deformity  SKIN: Warm and dry NEUROLOGIC:  Alert and oriented x 3; notes vertiginous sx with leaning forward during exam, no nystagmus PSYCHIATRIC:  Normal affect   ASSESSMENT:    1. Precordial pain   2. Morbid obesity with BMI of 40.0-44.9, adult (HCC)   3. Mixed hyperlipidemia    PLAN:    Precordial Chest pain Morbid Obesity HTN Mixed HLD - Would recommend CCTA with possible FFR as needed to exclude obstructive CAD and to assess for non-obstructive CAD requiring secondary prevention (metoprolol 100 mg) - will make med changes based on results - will see in Three months for f/u     Medication Adjustments/Labs and Tests Ordered: Current medicines are reviewed at length with the patient today.  Concerns regarding medicines are outlined above.  Orders Placed This Encounter  Procedures   CT CORONARY MORPH W/CTA COR W/SCORE W/CA W/CM &/OR WO/CM   Basic metabolic panel   EKG 12-Lead    Meds ordered this encounter  Medications   metoprolol tartrate (LOPRESSOR) 100 MG tablet    Sig: Take 2 hours prior to Cardiac CT    Dispense:  1 tablet    Refill:  0     Patient Instructions  Medication Instructions:   Your physician recommends that you continue on your current medications as directed. Please refer to the Current Medication list given to you today.  *If you need a refill on your cardiac medications before your next appointment, please call your pharmacy*   Lab Work: BMET If you have labs (blood work) drawn today and your tests are completely normal, you will receive your results only by: MyChart Message (if you have MyChart) OR A paper copy in the mail If you have any lab test that is abnormal or we need to change your treatment, we will call you to review the results.   Testing/Procedures: Your physician has requested that you have cardiac CT. Cardiac computed tomography (CT) is a painless test that uses an x-ray machine to take clear, detailed pictures of your heart. For further information please visit [email or web address] Please follow instruction sheet as given.     Follow-Up: At [facility], you and your health needs are our priority.  As part of our continuing mission to provide you with exceptional heart care, we have created designated Provider Care Teams.  These Care Teams include your primary Cardiologist (physician) and Advanced Practice Providers (APPs -  Physician Assistants and Nurse Practitioners) who all work together to provide you with the care you need, when you need it.   Your next appointment:   3 month(s)  The format for your next appointment:   In Person  Provider:   You may see Riley Lam, MD or one of the following Advanced Practice Providers on your designated Care Team:   Ronie Spies, PA-C Jacolyn Reedy, PA-C   Other Instructions   Your cardiac CT will be scheduled at one of the below locations:   [facility] [address] [place], [place] [address] [phone]   At [facility], please arrive at the [facility] main entrance (entrance A) of [facility] 30 minutes prior to test start time. Proceed to  the [facility] Radiology Department (first floor) to check-in  and test prep.   Please follow these instructions carefully (unless otherwise directed):   On the Night Before the Test: Be sure to Drink plenty of water. Do not consume any caffeinated/decaffeinated beverages or chocolate 12 hours prior to your test. Do not take any antihistamines (flonase and loratadine)  12 hours prior to your test.    On the Day of the Test: Drink plenty of water until 1 hour prior to the test. Do not eat any food 4 hours prior to the test. You may take your regular medications prior to the test.  Take metoprolol (Lopressor) 100 mg two hours prior to test. FEMALES- please wear underwire-free bra if available, avoid dresses & tight clothing   After the Test: Drink plenty of water. After receiving IV contrast, you may experience a mild flushed feeling. This is normal. On occasion, you may experience a mild rash up to 24 hours after the test. This is not dangerous. If this occurs, you can take Benadryl 25 mg and increase your fluid intake. If you experience trouble breathing, this can be serious. If it is severe call 911 IMMEDIATELY. If it is mild, please call our office. If you take any of these medications: Glipizide/Metformin, Avandament, Glucavance, please do not take 48 hours after completing test unless otherwise instructed.  Please allow 2-4 weeks for scheduling of routine cardiac CTs. Some insurance companies require a pre-authorization which may delay scheduling of this test.   For non-scheduling related questions, please contact the cardiac imaging nurse navigator should you have any questions/concerns: Rockwell Alexandria, Cardiac Imaging Nurse Navigator Larey Brick, Cardiac Imaging Nurse Navigator [facility] Direct Office Dial: [phone]   For scheduling needs, including cancellations and rescheduling, please call [place], [phone].    Signed, Christell Constant, MD  05/26/2021 5:02 PM    [facility]

## 2021-05-31 ENCOUNTER — Other Ambulatory Visit: Payer: BC Managed Care – PPO

## 2021-06-02 ENCOUNTER — Other Ambulatory Visit: Payer: Self-pay

## 2021-06-02 ENCOUNTER — Other Ambulatory Visit: Payer: BC Managed Care – PPO | Admitting: *Deleted

## 2021-06-02 DIAGNOSIS — R072 Precordial pain: Secondary | ICD-10-CM

## 2021-06-02 LAB — BASIC METABOLIC PANEL
BUN/Creatinine Ratio: 18 (ref 9–23)
BUN: 14 mg/dL (ref 6–24)
CO2: 23 mmol/L (ref 20–29)
Calcium: 10.1 mg/dL (ref 8.7–10.2)
Chloride: 101 mmol/L (ref 96–106)
Creatinine, Ser: 0.78 mg/dL (ref 0.57–1.00)
Glucose: 71 mg/dL (ref 65–99)
Potassium: 4.4 mmol/L (ref 3.5–5.2)
Sodium: 139 mmol/L (ref 134–144)
eGFR: 87 mL/min/{1.73_m2} (ref >59)

## 2021-06-03 ENCOUNTER — Encounter (HOSPITAL_COMMUNITY): Payer: Self-pay | Admitting: Emergency Medicine

## 2021-06-03 ENCOUNTER — Telehealth (HOSPITAL_COMMUNITY): Payer: Self-pay | Admitting: Emergency Medicine

## 2021-06-03 NOTE — Telephone Encounter (Signed)
Reaching out to patient to offer assistance regarding upcoming cardiac imaging study; pt verbalizes understanding of appt date/time, parking situation and where to check in, pre-test NPO status and medications ordered, and verified current allergies; name and call back number provided for further questions should they arise Rockwell Alexandria RN Navigator Cardiac Imaging [facility] [phone] office [phone] cell   100mg  metoprolol  Denies claustro Difficult iv start

## 2021-06-07 ENCOUNTER — Ambulatory Visit (HOSPITAL_COMMUNITY)
Admission: RE | Admit: 2021-06-07 | Discharge: 2021-06-07 | Disposition: A | Discharge status: Home / Self Care | Payer: BC Managed Care – PPO | Source: Ambulatory Visit | Attending: Internal Medicine | Admitting: Internal Medicine

## 2021-06-07 ENCOUNTER — Other Ambulatory Visit: Payer: Self-pay

## 2021-06-07 DIAGNOSIS — R072 Precordial pain: Secondary | ICD-10-CM | POA: Insufficient documentation

## 2021-06-07 MED ORDER — IOHEXOL 350 MG/ML SOLN
100.0000 mL | Freq: Once | INTRAVENOUS | Status: AC | PRN
  Administered 2021-06-07: 100 mL via INTRAVENOUS

## 2021-06-07 MED ORDER — NITROGLYCERIN 0.4 MG SL SUBL: 0.8000 mg | SUBLINGUAL_TABLET | Freq: Once | SUBLINGUAL | Status: DC

## 2021-06-07 MED ORDER — NITROGLYCERIN 0.4 MG SL SUBL
SUBLINGUAL_TABLET | SUBLINGUAL | Status: AC
  Filled 2021-06-07: qty 2

## 2021-06-07 MED ORDER — METOPROLOL TARTRATE 5 MG/5ML IV SOLN
5.0000 mg | INTRAVENOUS | Status: DC | PRN
  Administered 2021-06-07: 5 mg via INTRAVENOUS

## 2021-06-07 MED ORDER — NITROGLYCERIN 0.4 MG SL SUBL
0.8000 mg | SUBLINGUAL_TABLET | Freq: Once | SUBLINGUAL | Status: AC
  Administered 2021-06-07: 0.8 mg via SUBLINGUAL

## 2021-06-07 MED ORDER — METOPROLOL TARTRATE 5 MG/5ML IV SOLN: 5.0000 mg | INTRAVENOUS | Status: DC | PRN

## 2021-06-07 MED ORDER — METOPROLOL TARTRATE 5 MG/5ML IV SOLN
INTRAVENOUS | Status: AC
  Filled 2021-06-07: qty 10

## 2021-08-26 ENCOUNTER — Ambulatory Visit: Payer: BC Managed Care – PPO | Admitting: Internal Medicine

## 2021-10-18 NOTE — Progress Notes (Deleted)
Cardiology Office Note:    Date:  10/18/2021   ID:  Bonnie Schwartz, DOB 1961/06/20, MRN [id number]  PCP:  Elias Else, MD   [facility] Providers Cardiologist:  Christell Constant, MD     CC: Chest pain f/u  History of Present Illness:    Bonnie Schwartz is a 61 y.o. female with a hx of Morbid Obesity, HTN, and HLD who presents for evaluation 05/26/21.  No CAC on CT.  Patient notes that she is doing ***.   Since day prior/last visit notes *** . There are no*** interval hospital/ED visit.    No chest pain or pressure ***.  No SOB/DOE*** and no PND/Orthopnea***.  No weight gain or leg swelling***.  No palpitations or syncope ***.  Ambulatory blood pressure ***.   Past Medical History:  Diagnosis Date   Back pain    SPONDYLOLISTHESIS   Complication of anesthesia    Gallstone    one large gallstone - problems off and on with abd pain right sided, nausea   GERD (gastroesophageal reflux disease)    Headache(784.0)    migraine  with aura - "classic migraine equivalancy"loss of vision, left face goes numb - but no headache-first one was in 5th grade   Herpes    non active   Hypothyroidism    hasimotos   Pain    neck- bulging disc in neck   PONV (postoperative nausea and vomiting)    occassional N&V, Zofran did not work well for her for past surgeries   Poor venous access    PT STATES HER VEINS VERY SMALL AND DIFFICULT TO FIND   Seasonal allergies    Vitamin B12 deficiency     Past Surgical History:  Procedure Laterality Date   CHOLECYSTECTOMY N/A 06/19/2014   Procedure: LAPAROSCOPIC CHOLECYSTECTOMY WITH INTRAOPERATIVE CHOLANGIOGRAM;  Surgeon: Avel Peace, MD;  Location: [facility];  Service: General;  Laterality: N/A;   ENDOMETRIAL ABLATION     LAPAROSCOPIC GASTRIC SLEEVE RESECTION WITH HIATAL HERNIA REPAIR N/A 04/25/2016   Procedure: LAPAROSCOPIC GASTRIC SLEEVE RESECTION AND UPPER ENDO ;  Surgeon: Glenna Fellows, MD;  Location: [facility];  Service: General;   Laterality: N/A;   PELVIC FLOOR SURGERY     X2   perineal fistula repair     tear duct surgery  10/2015    Current Medications: No outpatient medications have been marked as taking for the 10/19/21 encounter (Appointment) with Christell Constant, MD.     Allergies:   Minocycline, Morphine and related, Oxycodone, and Erythromycin   Social History   Socioeconomic History   Marital status: Married    Spouse name: Not on file   Number of children: 4   Years of education: Not on file   Highest education level: Associate degree: academic program  Occupational History   Not on file  Tobacco Use   Smoking status: Never   Smokeless tobacco: Never  Vaping Use   Vaping Use: Never used  Substance and Sexual Activity   Alcohol use: No   Drug use: No   Sexual activity: Yes    Birth control/protection: None, Other-see comments    Comment: ablation 2006  Other Topics Concern   Not on file  Social History Narrative   Right handed    Lives at home with spouse   Drinks occasional caffeine   Social Determinants of Health   Financial Resource Strain: Not on file  Food Insecurity: Not on file  Transportation Needs: Not on file  Physical Activity: Not on file  Stress: Not on file  Social Connections: Not on file    Social: Works at [employer] History: The patient's family history includes Breast cancer in her maternal grandmother; Cancer (age of onset: 94) in her maternal grandmother; Cancer (age of onset: 9) in her mother; Colon cancer in her maternal grandmother; Dementia in her father; Diabetes in her mother; Hypertension in her mother; Stroke in her father. Sister has PAD, Father has vascular dementia.  ROS:   Please see the history of present illness.     All other systems reviewed and are negative.  EKGs/Labs/Other Studies Reviewed:    The following studies were reviewed today:  EKG:   05/26/21: SR rate 78   Cardiac CT: Date:  06/07/21 Results: IMPRESSION: 1. Coronary calcium score of 0. This was 0 percentile for age and sex matched control.   2.  Normal coronary origin with right dominance.   3.  No evidence of CAD.  CAD RADS 0.   4.  Consider non atherosclerotic causes of chest pain.   Recent Labs: 06/02/2021: BUN 14; Creatinine, Ser 0.78; Potassium 4.4; Sodium 139  Recent Lipid Panel No results found for: CHOL, TRIG, HDL, CHOLHDL, VLDL, LDLCALC, LDLDIRECT  Physical Exam:    VS:  There were no vitals taken for this visit.    Wt Readings from Last 3 Encounters:  05/26/21 97.1 kg  09/24/17 84.1 kg  07/13/16 97.3 kg     Gen: *** distress, *** obese/well nourished/malnourished   Neck: No JVD, *** carotid bruit Ears: Homero Fellers Sign Cardiac: No Rubs or Gallops, *** Murmur, ***cardia, *** radial pulses Respiratory: Clear to auscultation bilaterally, *** effort, ***  respiratory rate GI: Soft, nontender, non-distended *** MS: No *** edema; *** moves all extremities Integument: Skin feels *** Neuro:  At time of evaluation, alert and oriented to person/place/time/situation *** Psych: Normal affect, patient feels ***   ASSESSMENT:    No diagnosis found.  PLAN:    Non Cardiac CP Morbid Obesity HTN Mixed HLD - will see in Three months for f/u     Medication Adjustments/Labs and Tests Ordered: Current medicines are reviewed at length with the patient today.  Concerns regarding medicines are outlined above.  No orders of the defined types were placed in this encounter.   No orders of the defined types were placed in this encounter.    There are no Patient Instructions on file for this visit.   Signed, Christell Constant, MD  10/18/2021 7:01 PM    [facility]

## 2021-10-19 ENCOUNTER — Ambulatory Visit: Payer: BC Managed Care – PPO | Admitting: Internal Medicine

## 2022-04-09 ENCOUNTER — Encounter (HOSPITAL_COMMUNITY): Payer: Self-pay | Admitting: *Deleted

## 2022-04-09 ENCOUNTER — Observation Stay (HOSPITAL_COMMUNITY)
Admission: EM | Admit: 2022-04-09 | Discharge: 2022-04-11 | Disposition: A | Discharge status: Home / Self Care | Payer: BC Managed Care – PPO | Attending: Internal Medicine | Admitting: Internal Medicine

## 2022-04-09 ENCOUNTER — Other Ambulatory Visit: Payer: Self-pay

## 2022-04-09 ENCOUNTER — Emergency Department (HOSPITAL_COMMUNITY): Payer: BC Managed Care – PPO

## 2022-04-09 DIAGNOSIS — E039 Hypothyroidism, unspecified: Secondary | ICD-10-CM | POA: Diagnosis not present

## 2022-04-09 DIAGNOSIS — F419 Anxiety disorder, unspecified: Secondary | ICD-10-CM | POA: Diagnosis present

## 2022-04-09 DIAGNOSIS — E6609 Other obesity due to excess calories: Secondary | ICD-10-CM

## 2022-04-09 DIAGNOSIS — K5720 Diverticulitis of large intestine with perforation and abscess without bleeding: Secondary | ICD-10-CM | POA: Diagnosis not present

## 2022-04-09 DIAGNOSIS — R109 Unspecified abdominal pain: Secondary | ICD-10-CM | POA: Diagnosis present

## 2022-04-09 DIAGNOSIS — Z79899 Other long term (current) drug therapy: Secondary | ICD-10-CM | POA: Insufficient documentation

## 2022-04-09 DIAGNOSIS — E66812 Obesity, class 2: Secondary | ICD-10-CM

## 2022-04-09 HISTORY — DX: Obesity, unspecified: E66.9

## 2022-04-09 HISTORY — DX: Anxiety disorder, unspecified: F41.9

## 2022-04-09 HISTORY — DX: Attention-deficit hyperactivity disorder, unspecified type: F90.9

## 2022-04-09 LAB — CBC
HCT: 43.6 % (ref 36.0–46.0)
Hemoglobin: 14.1 g/dL (ref 12.0–15.0)
MCH: 28.3 pg (ref 26.0–34.0)
MCHC: 32.3 g/dL (ref 30.0–36.0)
MCV: 87.4 fL (ref 80.0–100.0)
Platelets: 345 10*3/uL (ref 150–400)
RBC: 4.99 MIL/uL (ref 3.87–5.11)
RDW: 15.5 % (ref 11.5–15.5)
WBC: 14.1 10*3/uL — ABNORMAL HIGH (ref 4.0–10.5)
nRBC: 0 % (ref 0.0–0.2)

## 2022-04-09 LAB — COMPREHENSIVE METABOLIC PANEL
ALT: 24 U/L (ref 0–44)
AST: 22 U/L (ref 15–41)
Albumin: 3.7 g/dL (ref 3.5–5.0)
Alkaline Phosphatase: 80 U/L (ref 38–126)
Anion gap: 14 (ref 5–15)
BUN: 9 mg/dL (ref 6–20)
CO2: 22 mmol/L (ref 22–32)
Calcium: 9.4 mg/dL (ref 8.9–10.3)
Chloride: 100 mmol/L (ref 98–111)
Creatinine, Ser: 0.83 mg/dL (ref 0.44–1.00)
GFR, Estimated: >60 mL/min (ref >60)
Glucose, Bld: 113 mg/dL — ABNORMAL HIGH (ref 70–99)
Potassium: 3.7 mmol/L (ref 3.5–5.1)
Sodium: 136 mmol/L (ref 135–145)
Total Bilirubin: 1 mg/dL (ref 0.3–1.2)
Total Protein: 7.3 g/dL (ref 6.5–8.1)

## 2022-04-09 LAB — URINALYSIS, ROUTINE W REFLEX MICROSCOPIC
Bilirubin Urine: NEGATIVE
Glucose, UA: NEGATIVE mg/dL
Hgb urine dipstick: NEGATIVE
Ketones, ur: NEGATIVE mg/dL
Leukocytes,Ua: NEGATIVE
Nitrite: NEGATIVE
Protein, ur: NEGATIVE mg/dL
Specific Gravity, Urine: 1.005 (ref 1.005–1.030)
pH: 6 (ref 5.0–8.0)

## 2022-04-09 LAB — LIPASE, BLOOD: Lipase: 27 U/L (ref 11–51)

## 2022-04-09 LAB — I-STAT BETA HCG BLOOD, ED (MC, WL, AP ONLY): I-stat hCG, quantitative: <5 m[IU]/mL (ref <5)

## 2022-04-09 MED ORDER — PIPERACILLIN-TAZOBACTAM 3.375 G IVPB 30 MIN
3.3750 g | Freq: Once | INTRAVENOUS | Status: AC
  Administered 2022-04-09: 3.375 g via INTRAVENOUS
  Filled 2022-04-09: qty 50

## 2022-04-09 MED ORDER — LORAZEPAM 1 MG PO TABS: 1.0000 mg | ORAL_TABLET | Freq: Every day | ORAL | Status: DC

## 2022-04-09 MED ORDER — LORAZEPAM 1 MG PO TABS
1.0000 mg | ORAL_TABLET | Freq: Every day | ORAL | Status: DC | PRN
Start: 2022-04-09 — End: 2022-04-10

## 2022-04-09 MED ORDER — IOHEXOL 300 MG/ML  SOLN
100.0000 mL | Freq: Once | INTRAMUSCULAR | Status: AC | PRN
  Administered 2022-04-09: 100 mL via INTRAVENOUS

## 2022-04-09 MED ORDER — ONDANSETRON HCL 4 MG/2ML IJ SOLN: 4.0000 mg | Freq: Four times a day (QID) | INTRAMUSCULAR | Status: DC | PRN

## 2022-04-09 MED ORDER — MORPHINE SULFATE (PF) 2 MG/ML IV SOLN: 2.0000 mg | INTRAVENOUS | Status: DC | PRN

## 2022-04-09 MED ORDER — ENOXAPARIN SODIUM 40 MG/0.4ML IJ SOSY
40.0000 mg | PREFILLED_SYRINGE | INTRAMUSCULAR | Status: DC
  Administered 2022-04-10 – 2022-04-11 (×2): 40 mg via SUBCUTANEOUS
  Filled 2022-04-09 (×2): qty 0.4

## 2022-04-09 MED ORDER — ACETAMINOPHEN 325 MG PO TABS: 650.0000 mg | ORAL_TABLET | Freq: Four times a day (QID) | ORAL | Status: DC | PRN

## 2022-04-09 MED ORDER — PIPERACILLIN-TAZOBACTAM 3.375 G IVPB
3.3750 g | Freq: Three times a day (TID) | INTRAVENOUS | Status: DC
  Administered 2022-04-09 – 2022-04-11 (×5): 3.375 g via INTRAVENOUS
  Filled 2022-04-09 (×5): qty 50

## 2022-04-09 MED ORDER — BUPROPION HCL ER (XL) 150 MG PO TB24
150.0000 mg | ORAL_TABLET | Freq: Every morning | ORAL | Status: DC
  Administered 2022-04-10 – 2022-04-11 (×2): 150 mg via ORAL
  Filled 2022-04-09 (×2): qty 1

## 2022-04-09 MED ORDER — LACTATED RINGERS IV SOLN: INTRAVENOUS | Status: DC

## 2022-04-09 MED ORDER — SODIUM CHLORIDE 0.9 % IV BOLUS
1000.0000 mL | Freq: Once | INTRAVENOUS | Status: AC
  Administered 2022-04-09: 1000 mL via INTRAVENOUS

## 2022-04-09 MED ORDER — HYDRALAZINE HCL 20 MG/ML IJ SOLN: 10.0000 mg | INTRAMUSCULAR | Status: DC | PRN

## 2022-04-09 MED ORDER — LEVOTHYROXINE SODIUM 112 MCG PO TABS
112.0000 ug | ORAL_TABLET | Freq: Every day | ORAL | Status: DC
  Administered 2022-04-10 – 2022-04-11 (×2): 112 ug via ORAL
  Filled 2022-04-09 (×2): qty 1

## 2022-04-09 MED ORDER — ONDANSETRON 4 MG PO TBDP: 4.0000 mg | ORAL_TABLET | Freq: Four times a day (QID) | ORAL | Status: DC | PRN

## 2022-04-09 MED ORDER — ACETAMINOPHEN 650 MG RE SUPP: 650.0000 mg | Freq: Four times a day (QID) | RECTAL | Status: DC | PRN

## 2022-04-09 MED ORDER — OXYCODONE HCL 5 MG PO TABS: 5.0000 mg | ORAL_TABLET | ORAL | Status: DC | PRN

## 2022-04-09 NOTE — ED Provider Notes (Signed)
[facility] EMERGENCY DEPARTMENT Provider Note   CSN: [id number] Arrival date & time: 04/09/22  9242     History  Chief Complaint  Patient presents with   Abdominal Pain    Bonnie Schwartz is a 61 y.o. female.  HPI Patient presents with her husband who assists with the history.  She is generally well, was until about 4 days ago.  She gradually, at that point, developed left lower quadrant pain, anorexia.  Since then the pain has been persistent, maximal sensation yesterday, now 7/10.  Pain is still focally in the left lower quadrant, essentially nonradiating.  There is also persistent anorexia, but no vomiting, no diarrhea, no fever.  Also no chest pain, no dyspnea.  No history of abdominal surgery beyond gastric sleeve 7 years ago.    Home Medications Prior to Admission medications   Medication Sig Start Date End Date Taking? Authorizing Provider  buPROPion (WELLBUTRIN XL) 150 MG 24 hr tablet Take 150 mg by mouth every morning. 11/21/19   [provider]  Calcium Citrate-Vitamin D (CALCIUM CITRATE + PO) Take 1 tablet by mouth daily.    [provider]  cholecalciferol (VITAMIN D) 1000 UNITS tablet Take 1,000 Units by mouth daily.    [provider]  CLARAVIS 20 MG capsule Take 20 mg by mouth daily. 05/16/21   [provider]  cyclobenzaprine (FLEXERIL) 10 MG tablet Take 10 mg by mouth 3 (three) times daily as needed for muscle spasms.    [provider]  Fish Oil-Cholecalciferol (FISH OIL + D3 PO) Take 1 capsule by mouth daily.     [provider]  fluticasone (FLONASE) 50 MCG/ACT nasal spray Place 2 sprays into both nostrils as needed. 01/17/21   [provider]  levothyroxine (SYNTHROID, LEVOTHROID) 112 MCG tablet Take 112 mcg by mouth daily before breakfast.    [provider]  lisdexamfetamine (VYVANSE) 50 MG capsule Take 50 mg by mouth daily.    [provider]  Loratadine 10 MG CAPS  Claritin    [provider]  LORazepam (ATIVAN) 1 MG tablet Take 1 mg by mouth at bedtime. Reported on 02/24/2016    [provider]  magnesium oxide (MAG-OX) 400 MG tablet Take 400 mg by mouth daily.    [provider]  meloxicam (MOBIC) 15 MG tablet TAKE 1 TABLET BY MOUTH EVERY DAY 12/30/19   Felecia Shelling, DPM  metoprolol tartrate (LOPRESSOR) 100 MG tablet Take 2 hours prior to Cardiac CT 05/26/21   Christell Constant, MD  Multiple Vitamin (MULTIVITAMIN WITH MINERALS) TABS tablet Take 1 tablet by mouth daily.    [provider]  omeprazole (PRILOSEC) 20 MG capsule Take 20 mg by mouth daily. 10/27/19   [provider]  promethazine (PHENERGAN) 12.5 MG tablet Take 1 tablet (12.5 mg total) by mouth every 6 (six) hours as needed for nausea or vomiting. 12/04/19   Felecia Shelling, DPM  valACYclovir (VALTREX) 1000 MG tablet Take 1,000 mg by mouth as needed.     [provider]  vitamin B-12 (CYANOCOBALAMIN) 500 MCG tablet Take 500 mcg by mouth daily.    [provider]      Allergies    Minocycline, Morphine and related, Oxycodone, and Erythromycin    Review of Systems   Review of Systems  All other systems reviewed and are negative.   Physical Exam Updated Vital Signs BP 133/71   Pulse 60   Temp 98.6 F (37  C)   Resp (!) 27   Ht 5\' 5"  (1.651 m)   Wt 97.5 kg   SpO2 98%   BMI 35.78 kg/m  Physical Exam Vitals and nursing note reviewed.  Constitutional:      General: She is not in acute distress.    Appearance: She is well-developed.  HENT:     Head: Normocephalic and atraumatic.  Eyes:     Conjunctiva/sclera: Conjunctivae normal.  Cardiovascular:     Rate and Rhythm: Normal rate and regular rhythm.  Pulmonary:     Effort: Pulmonary effort is normal. No respiratory distress.     Breath sounds: Normal breath sounds. No stridor.  Abdominal:     General: There is no distension.     Tenderness: There is abdominal  tenderness in the left lower quadrant. There is guarding.  Skin:    General: Skin is warm and dry.  Neurological:     Mental Status: She is alert and oriented to person, place, and time.     Cranial Nerves: No cranial nerve deficit.  Psychiatric:        Mood and Affect: Mood normal.     ED Results / Procedures / Treatments   Labs (all labs ordered are listed, but only abnormal results are displayed) Labs Reviewed  COMPREHENSIVE METABOLIC PANEL - Abnormal; Notable for the following components:      Result Value   Glucose, Bld 113 (*)    All other components within normal limits  CBC - Abnormal; Notable for the following components:   WBC 14.1 (*)    All other components within normal limits  URINALYSIS, ROUTINE W REFLEX MICROSCOPIC - Abnormal; Notable for the following components:   APPearance HAZY (*)    All other components within normal limits  LIPASE, BLOOD  I-STAT BETA HCG BLOOD, ED ([facility], [facility], [facility] ONLY)    EKG None  Radiology CT Abdomen Pelvis W Contrast  Result Date: 04/09/2022 CLINICAL DATA:  LLQ abdominal pain EXAM: CT ABDOMEN AND PELVIS WITH CONTRAST TECHNIQUE: Multidetector CT imaging of the abdomen and pelvis was performed using the standard protocol following bolus administration of intravenous contrast. RADIATION DOSE REDUCTION: This exam was performed according to the departmental dose-optimization program which includes automated exposure control, adjustment of the mA and/or kV according to patient size and/or use of iterative reconstruction technique. CONTRAST:  06/10/2022 OMNIPAQUE IOHEXOL 300 MG/ML  SOLN COMPARISON:  CT [facility], 12/15/2003.  Upper GI 02/21/2016. FINDINGS: Lower chest: No acute abnormality. Hepatobiliary: No focal liver abnormality. Status post cholecystectomy. No biliary dilatation. Pancreas: No pancreatic ductal dilatation or surrounding inflammatory changes. Spleen: Normal in size without focal abnormality. Small perihilar accessory spleen Adrenals/Urinary  Tract: Adrenal glands are unremarkable. Kidneys are normal, without renal calculi, focal lesion, or hydronephrosis. Bladder is unremarkable. Stomach/Bowel: Small hiatus hernia. Partial gastrectomy. Appendix is normal. A mild-to-moderate burden of sigmoid diverticulosis is present. Pericolonic stranding with small locules of gas are present at the distal descending colon and proximal sigmoid. Trace layering fluid along the LEFT pericolic gutter. No focal drainable collection or abscess. Vascular/Lymphatic: No significant vascular findings are present. No enlarged abdominal or pelvic lymph nodes. Reproductive: Nonenlarged fibroid uterus.  adnexa are unremarkable. Other: Small fat-containing umbilical hernia. No abdominopelvic ascites. Musculoskeletal: Bilateral L5 spondylolysis with 0.5 cm anterolisthesis. No acute osseous findings. IMPRESSION: 1. Acute proximal sigmoid diverticulitis. Findings suspicious for micro perforation, as described above, however no focal drainable collection or abscess. 2. Grade 1 L5 spondylolisthesis. Additional incidental, chronic and senescent findings as  above. Electronically Signed   By: Roanna Banning M.D.   On: 04/09/2022 09:54    Procedures Procedures    Medications Ordered in ED Medications  piperacillin-tazobactam (ZOSYN) IVPB 3.375 g (has no administration in time range)  sodium chloride 0.9 % bolus 1,000 mL (1,000 mLs Intravenous New Bag/Given 04/09/22 0905)  iohexol (OMNIPAQUE) 300 MG/ML solution 100 mL (100 mLs Intravenous Contrast Given 04/09/22 3818)    ED Course/ Medical Decision Making/ A&P This patient w gastric sleeve in the distant past presents to the ED for concern of left lower quadrant pain anorexia, weakness , this involves an extensive number of treatment options, and is a complaint that carries with it a high risk of complications and morbidity.    The differential diagnosis includes diverticulitis, abscess, reproductive system dysfunction, bacteremia,  sepsis, perforation   Social Determinants of Health:  No limitations  Additional history obtained:  Additional history and/or information obtained from husband, notable for details of HPI included above   After the initial evaluation, orders, including: CT fluids labs were initiated.   Patient placed on Cardiac and Pulse-Oximetry Monitors. The patient was maintained on a cardiac monitor.  The cardiac monitored showed an rhythm of 90 sinus normal The patient was also maintained on pulse oximetry. The readings were typically 100% room air normal   On repeat evaluation of the patient improved  Lab Tests:  I personally interpreted labs.  The pertinent results include: Leukocytosis  Imaging Studies ordered:  I independently visualized and interpreted imaging which showed evidence for diverticulitis, fluid in the left lateral lower quadrant, possible microperforation I agree with the radiologist interpretation  Consultations Obtained:  I requested consultation with the general surgery and internal medicine,  and discussed lab and imaging findings as well as pertinent plan - they recommend: At bedside and discussed the case with our PA from general surgery, and discussed the case with the attending via telephone.  Recommendation is for admission with IV antibiotics  Dispostion / Final MDM:  After consideration of the diagnostic results and the patient's response to treatment, adult female presents with left lower quadrant abdominal pain, anorexia, is found to have diverticulitis with questionable microperforation.  She is local, not generalized peritonitis, has leukocytosis, but is afebrile, without hypotension, reassuring, little evidence for bacteremia, sepsis.  Patient admitted for monitoring, management.  Final Clinical Impression(s) / ED Diagnoses Final diagnoses:  Diverticulitis of large intestine with perforation without bleeding     Gerhard Munch, MD 04/09/22 1101

## 2022-04-09 NOTE — Plan of Care (Signed)

## 2022-04-09 NOTE — ED Notes (Signed)
Transport requested

## 2022-04-09 NOTE — ED Triage Notes (Signed)
The  pt is of lower abd pain since this past Thursday no n v or diarrhea  no fever  the pain was worse yesterday

## 2022-04-09 NOTE — H&P (Signed)
History and Physical    Patient: Bonnie Schwartz YQM:[id number] DOB: 1961-07-30 DOA: 04/09/2022 DOS: the patient was seen and examined on 04/09/2022 PCP: Elias Else, MD  Patient coming from: Home - lives with husband; [place], [phone]    Chief Complaint: Abdominal pain  HPI: Bonnie Schwartz is a 61 y.o. female with medical history significant of back pain, obesity s/p gastric sleeve, and hypothyroidism presenting with abdominal pain.  She reports developing LLQ pain on Thursday.  No dietary issues.  No fever.  No vomiting, today with mild nausea.  Drinking ok, anorexia, "it feels bad."  Last BM was yesterday AM, small and solid.    ER Course:  Diverticulitis.  LLQ pain x days, ?microperf on CT with diverticulitis.  Surgery has seen, given Zosyn.     Review of Systems: As mentioned in the history of present illness. All other systems reviewed and are negative. Past Medical History:  Diagnosis Date   ADHD    Anxiety    Back pain    SPONDYLOLISTHESIS   Complication of anesthesia    Gallstone    one large gallstone - problems off and on with abd pain right sided, nausea   GERD (gastroesophageal reflux disease)    Headache(784.0)    migraine  with aura - "classic migraine equivalancy"loss of vision, left face goes numb - but no headache-first one was in 5th grade   Herpes    non active   Hypothyroidism    hasimotos   Obesity    s/p gastric sleeve   Pain    neck- bulging disc in neck   PONV (postoperative nausea and vomiting)    occassional N&V, Zofran did not work well for her for past surgeries   Poor venous access    PT STATES HER VEINS VERY SMALL AND DIFFICULT TO FIND   Seasonal allergies    Vitamin B12 deficiency    Past Surgical History:  Procedure Laterality Date   CHOLECYSTECTOMY N/A 06/19/2014   Procedure: LAPAROSCOPIC CHOLECYSTECTOMY WITH INTRAOPERATIVE CHOLANGIOGRAM;  Surgeon: Avel Peace, MD;  Location: [facility];  Service: General;  Laterality:  N/A;   ENDOMETRIAL ABLATION     LAPAROSCOPIC GASTRIC SLEEVE RESECTION WITH HIATAL HERNIA REPAIR N/A 04/25/2016   Procedure: LAPAROSCOPIC GASTRIC SLEEVE RESECTION AND UPPER ENDO ;  Surgeon: Glenna Fellows, MD;  Location: [facility];  Service: General;  Laterality: N/A;   PELVIC FLOOR SURGERY     X2   perineal fistula repair     tear duct surgery  10/2015   Social History:  reports that she has never smoked. She has never used smokeless tobacco. She reports that she does not drink alcohol and does not use drugs.  Allergies  Allergen Reactions   Minocin [Minocycline] Anaphylaxis and Swelling    Neck pain   Morphine And Related Nausea And Vomiting   Roxicodone [Oxycodone] Nausea And Vomiting   Erythromycin Rash    Family History  Problem Relation Age of Onset   Cancer Mother 3       UTERINE   Diabetes Mother    Hypertension Mother    Breast cancer Maternal Grandmother    Cancer Maternal Grandmother 73       OVARIAN   Colon cancer Maternal Grandmother    Stroke Father    Dementia Father     Prior to Admission medications   Medication Sig Start Date End Date Taking? Authorizing Provider  buPROPion (WELLBUTRIN XL) 150 MG 24 hr tablet Take 150 mg by mouth  every morning. 11/21/19   [provider]  Calcium Citrate-Vitamin D (CALCIUM CITRATE + PO) Take 1 tablet by mouth daily.    [provider]  cholecalciferol (VITAMIN D) 1000 UNITS tablet Take 1,000 Units by mouth daily.    [provider]  CLARAVIS 20 MG capsule Take 20 mg by mouth daily. 05/16/21   [provider]  cyclobenzaprine (FLEXERIL) 10 MG tablet Take 10 mg by mouth 3 (three) times daily as needed for muscle spasms.    [provider]  Fish Oil-Cholecalciferol (FISH OIL + D3 PO) Take 1 capsule by mouth daily.     [provider]  fluticasone (FLONASE) 50 MCG/ACT nasal spray Place 2 sprays into both nostrils as needed. 01/17/21   [provider]  levothyroxine  (SYNTHROID, LEVOTHROID) 112 MCG tablet Take 112 mcg by mouth daily before breakfast.    [provider]  lisdexamfetamine (VYVANSE) 50 MG capsule Take 50 mg by mouth daily.    [provider]  Loratadine 10 MG CAPS Claritin    [provider]  LORazepam (ATIVAN) 1 MG tablet Take 1 mg by mouth at bedtime. Reported on 02/24/2016    [provider]  magnesium oxide (MAG-OX) 400 MG tablet Take 400 mg by mouth daily.    [provider]  meloxicam (MOBIC) 15 MG tablet TAKE 1 TABLET BY MOUTH EVERY DAY 12/30/19   Felecia Shelling, DPM  metoprolol tartrate (LOPRESSOR) 100 MG tablet Take 2 hours prior to Cardiac CT 05/26/21   Christell Constant, MD  Multiple Vitamin (MULTIVITAMIN WITH MINERALS) TABS tablet Take 1 tablet by mouth daily.    [provider]  omeprazole (PRILOSEC) 20 MG capsule Take 20 mg by mouth daily. 10/27/19   [provider]  promethazine (PHENERGAN) 12.5 MG tablet Take 1 tablet (12.5 mg total) by mouth every 6 (six) hours as needed for nausea or vomiting. 12/04/19   Felecia Shelling, DPM  valACYclovir (VALTREX) 1000 MG tablet Take 1,000 mg by mouth as needed.     [provider]  vitamin B-12 (CYANOCOBALAMIN) 500 MCG tablet Take 500 mcg by mouth daily.    [provider]    Physical Exam: Vitals:   04/09/22 1255 04/09/22 1300 04/09/22 1315 04/09/22 1330  BP: 126/84 128/74 125/81 110/60  Pulse: 86 83 88 88  Resp: 13 15 17  (!) 22  Temp:      SpO2: 98% 98% 97% 95%  Weight:      Height:       General:  Appears calm and comfortable and is in NAD Eyes:  PERRL, EOMI, normal lids, iris ENT:  grossly normal hearing, lips & tongue, mmm; appropriate dentition Neck:  no LAD, masses or thyromegaly Cardiovascular:  RRR, no m/r/g. No LE edema.  Respiratory:   CTA bilaterally with no wheezes/rales/rhonchi.  Normal respiratory effort. Abdomen:  soft, TTP in LLQ, ND Skin:  no rash or induration seen on limited  exam Musculoskeletal:  grossly normal tone BUE/BLE, good ROM, no bony abnormality Psychiatric:  grossly normal mood and affect, speech fluent and appropriate, AOx3 Neurologic:  CN 2-12 grossly intact, moves all extremities in coordinated fashion   Radiological Exams on Admission: Independently reviewed - see discussion in A/P where applicable  CT Abdomen Pelvis W Contrast  Result Date: 04/09/2022 CLINICAL DATA:  LLQ abdominal pain EXAM: CT ABDOMEN AND PELVIS WITH CONTRAST TECHNIQUE: Multidetector CT imaging of the abdomen and pelvis was performed using the standard protocol following bolus administration  of intravenous contrast. RADIATION DOSE REDUCTION: This exam was performed according to the departmental dose-optimization program which includes automated exposure control, adjustment of the mA and/or kV according to patient size and/or use of iterative reconstruction technique. CONTRAST:  OMNIPAQUE IOHEXOL 300 MG/ML  SOLN COMPARISON:  CT [facility], 12/15/2003.  Upper GI 02/21/2016. FINDINGS: Lower chest: No acute abnormality. Hepatobiliary: No focal liver abnormality. Status post cholecystectomy. No biliary dilatation. Pancreas: No pancreatic ductal dilatation or surrounding inflammatory changes. Spleen: Normal in size without focal abnormality. Small perihilar accessory spleen Adrenals/Urinary Tract: Adrenal glands are unremarkable. Kidneys are normal, without renal calculi, focal lesion, or hydronephrosis. Bladder is unremarkable. Stomach/Bowel: Small hiatus hernia. Partial gastrectomy. Appendix is normal. A mild-to-moderate burden of sigmoid diverticulosis is present. Pericolonic stranding with small locules of gas are present at the distal descending colon and proximal sigmoid. Trace layering fluid along the LEFT pericolic gutter. No focal drainable collection or abscess. Vascular/Lymphatic: No significant vascular findings are present. No enlarged abdominal or pelvic lymph nodes. Reproductive:  Nonenlarged fibroid uterus.  adnexa are unremarkable. Other: Small fat-containing umbilical hernia. No abdominopelvic ascites. Musculoskeletal: Bilateral L5 spondylolysis with 0.5 cm anterolisthesis. No acute osseous findings. IMPRESSION: 1. Acute proximal sigmoid diverticulitis. Findings suspicious for micro perforation, as described above, however no focal drainable collection or abscess. 2. Grade 1 L5 spondylolisthesis. Additional incidental, chronic and senescent findings as above. Electronically Signed   By: Roanna Banning M.D.   On: 04/09/2022 09:54    EKG: not done   Labs on Admission: I have personally reviewed the available labs and imaging studies at the time of the admission.  Pertinent labs:    Glucose 113 WBC 14.1 UA WNL   Assessment and Plan: Principal Problem:   Diverticulitis of colon with perforation Active Problems:   Class 2 obesity due to excess calories with body mass index (BMI) of 35.0 to 35.9 in adult   Anxiety   Hypothyroidism    Diverticulitis with microperforation -Patient's symptoms are c/w diverticulitis and her CT supports this as a diagnosis -There is concern for microperforation but without apparent drainable abscess -Her only SIRS criteria is leukocytosis -For now, will give bowel rest (surgery is ok with clear liquids per patient report and confirmed by surgery PA), IVF, pain medication with morphine, nausea medication with Zofran, and treat with Zosyn for intraabdominal infection -Surgery is consulting  Anxiety -Continue Wellbutrin, Ativan -Hold ADHD medication - she will not be performing tasks that require attention and focus as an inpatient   Hypothyroidism -Continue Synthroid  Obesity -Body mass index is 35.78 kg/m. -She is s/p gastric sleeve -Weight loss should be encouraged on an ongoing basis -Outpatient PCP/bariatric medicine/bariatric surgery f/u encouraged     Advance Care Planning:   Code Status: Full Code   Consults:  Surgery  DVT Prophylaxis: Lovenox  Family Communication: None present; she is capable of communicating with family at this time  Severity of Illness: The appropriate patient status for this patient is INPATIENT. Inpatient status is judged to be reasonable and necessary in order to provide the required intensity of service to ensure the patient's safety. The patient's presenting symptoms, physical exam findings, and initial radiographic and laboratory data in the context of their chronic comorbidities is felt to place them at high risk for further clinical deterioration. Furthermore, it is not anticipated that the patient will be medically stable for discharge from the hospital within 2 midnights of admission.   * I certify that at the point of admission it  is my clinical judgment that the patient will require inpatient hospital care spanning beyond 2 midnights from the point of admission due to high intensity of service, high risk for further deterioration and high frequency of surveillance required.*  Author: Jonah Blue, MD 04/09/2022 1:41 PM  For on call review [email or web address].

## 2022-04-09 NOTE — ED Notes (Signed)
Pharmacy Tech at bedside  

## 2022-04-09 NOTE — ED Notes (Signed)
All belongings sent w/ transport.

## 2022-04-09 NOTE — Consult Note (Addendum)
Bonnie Schwartz [birth year]  [id number].    Requesting MD: Blake Divine, MD Chief Complaint/Reason for Consult: diverticulitis  HPI:  61 y/o F who presents with worsening lower abdominal pain. Patient states that on Thursday (3 days ago) she started having lower abdominal pain that was vague. She states on Friday and Saturday it became much worse, localized to her left lower quadrant, and kept her from standing up straight. It was slightly relieved by heating pad and a warm bath last night. It is associated with constipation and poor oral intake. She denies chills, fever, nausea, vomiting, melena/hematochezia or diarrhea. She denies any previous history of similar pain. States her last colonoscopy was about 8 years ago and was normal - says she is due for colonoscopy in 2024. Her past abdominal surgeries are gastric sleeve in 2017 and lap cholecystectomy 2015. She denies alcohol or drug use. Her husband is at the bedside. She currently works at [employer].  ROS: AS above Review of Systems  All other systems reviewed and are negative.   Family History  Problem Relation Age of Onset   Cancer Mother 54       UTERINE   Diabetes Mother    Hypertension Mother    Breast cancer Maternal Grandmother    Cancer Maternal Grandmother 62       OVARIAN   Colon cancer Maternal Grandmother    Stroke Father    Dementia Father     Past Medical History:  Diagnosis Date   Back pain    SPONDYLOLISTHESIS   Complication of anesthesia    Gallstone    one large gallstone - problems off and on with abd pain right sided, nausea   GERD (gastroesophageal reflux disease)    Headache(784.0)    migraine  with aura - "classic migraine equivalancy"loss of vision, left face goes numb - but no headache-first one was in 5th grade   Herpes    non active   Hypothyroidism    hasimotos   Pain    neck- bulging disc in neck   PONV (postoperative nausea and vomiting)    occassional N&V, Zofran did not work well for her  for past surgeries   Poor venous access    PT STATES HER VEINS VERY SMALL AND DIFFICULT TO FIND   Seasonal allergies    Vitamin B12 deficiency     Past Surgical History:  Procedure Laterality Date   CHOLECYSTECTOMY N/A 06/19/2014   Procedure: LAPAROSCOPIC CHOLECYSTECTOMY WITH INTRAOPERATIVE CHOLANGIOGRAM;  Surgeon: Avel Peace, MD;  Location: [facility];  Service: General;  Laterality: N/A;   ENDOMETRIAL ABLATION     LAPAROSCOPIC GASTRIC SLEEVE RESECTION WITH HIATAL HERNIA REPAIR N/A 04/25/2016   Procedure: LAPAROSCOPIC GASTRIC SLEEVE RESECTION AND UPPER ENDO ;  Surgeon: Glenna Fellows, MD;  Location: [facility];  Service: General;  Laterality: N/A;   PELVIC FLOOR SURGERY     X2   perineal fistula repair     tear duct surgery  10/2015    Social History:  reports that she has never smoked. She has never used smokeless tobacco. She reports that she does not drink alcohol and does not use drugs.  Allergies:  Allergies  Allergen Reactions   Minocycline Anaphylaxis    Neck pain   Morphine And Related Nausea Only   Oxycodone Nausea Only   Erythromycin Rash    (Not in a hospital admission)    Physical Exam: Blood pressure 133/71, pulse 60, temperature 98.6 F (37 C), resp. rate Marland Kitchen)  27, height 5\' 5"  (1.651 m), weight 97.5 kg, SpO2 98 %. General: Pleasant white female laying on hospital bed, appears stated age, NAD. HEENT: head -normocephalic, atraumatic; Eyes: PERRLA, no conjunctival injection;  exudates.  Neck- Trachea is midline, no thyromegaly or JVD appreciated.  CV- RRR, normal S1/S2, no M/R/G, no peripheral edema Pulm- breathing is non-labored. CTABL, no wheezes, rhales, rhonchi. Abd- soft, mild distention, TTP LLQ without rebound tenderness, no hernias, no organomegaly. GU- deferred  MSK- UE/LE symmetrical, no cyanosis, clubbing, or edema. Neuro- CN II-XII grossly in tact, no paresthesias. Psych- Alert and Oriented x3 with appropriate affect Skin: warm and dry, no rashes  or lesions   Results for orders placed or performed during the hospital encounter of 04/09/22 (from the past 48 hour(s))  Lipase, blood     Status: None   Collection Time: 04/09/22  7:00 AM  Result Value Ref Range   Lipase 27 11 - 51 U/L    Comment: Performed at [facility], [address] [place], [place]  Comprehensive metabolic panel     Status: Abnormal   Collection Time: 04/09/22  7:00 AM  Result Value Ref Range   Sodium 136 135 - 145 mmol/L   Potassium 3.7 3.5 - 5.1 mmol/L   Chloride 100 98 - 111 mmol/L   CO2 22 22 - 32 mmol/L   Glucose, Bld 113 (H) 70 - 99 mg/dL    Comment: Glucose reference range applies only to samples taken after fasting for at least 8 hours.   BUN 9 6 - 20 mg/dL   Creatinine, Ser 06/10/22 0.44 - 1.00 mg/dL   Calcium 9.4 8.9 - 2.77 mg/dL   Total Protein 7.3 6.5 - 8.1 g/dL   Albumin 3.7 3.5 - 5.0 g/dL   AST 22 15 - 41 U/L   ALT 24 0 - 44 U/L   Alkaline Phosphatase 80 38 - 126 U/L   Total Bilirubin 1.0 0.3 - 1.2 mg/dL   GFR, Estimated 82.4 >23 mL/min    Comment: (NOTE) Calculated using the CKD-EPI Creatinine Equation (2021)    Anion gap 14 5 - 15    Comment: Performed at [facility], [address], [place], [place]  CBC     Status: Abnormal   Collection Time: 04/09/22  7:00 AM  Result Value Ref Range   WBC 14.1 (H) 4.0 - 10.5 K/uL   RBC 4.99 3.87 - 5.11 MIL/uL   Hemoglobin 14.1 12.0 - 15.0 g/dL   HCT 06/10/22 15.4 - 00.8 %   MCV 87.4 80.0 - 100.0 fL   MCH 28.3 26.0 - 34.0 pg   MCHC 32.3 30.0 - 36.0 g/dL   RDW 67.6 19.5 - 09.3 %   Platelets 345 150 - 400 K/uL   nRBC 0.0 0.0 - 0.2 %    Comment: Performed at [facility], [address], [place], [place]  I-Stat beta hCG blood, ED     Status: None   Collection Time: 04/09/22  7:10 AM  Result Value Ref Range   I-stat hCG, quantitative <5.0 <5 mIU/mL   Comment 3            Comment:   GEST. AGE      CONC.  (mIU/mL)   <=1 WEEK        5 - 50     2 WEEKS        50 - 500     3 WEEKS  100 - 10,000     4 WEEKS     1,000 - 30,000        FEMALE AND NON-PREGNANT FEMALE:     LESS THAN 5 mIU/mL   Urinalysis, Routine w reflex microscopic     Status: Abnormal   Collection Time: 04/09/22  8:43 AM  Result Value Ref Range   Color, Urine YELLOW YELLOW   APPearance HAZY (A) CLEAR   Specific Gravity, Urine 1.005 1.005 - 1.030   pH 6.0 5.0 - 8.0   Glucose, UA NEGATIVE NEGATIVE mg/dL   Hgb urine dipstick NEGATIVE NEGATIVE   Bilirubin Urine NEGATIVE NEGATIVE   Ketones, ur NEGATIVE NEGATIVE mg/dL   Protein, ur NEGATIVE NEGATIVE mg/dL   Nitrite NEGATIVE NEGATIVE   Leukocytes,Ua NEGATIVE NEGATIVE    Comment: Performed at [facility], [address], [place], [place] [address]   CT Abdomen Pelvis W Contrast  Result Date: 04/09/2022 CLINICAL DATA:  LLQ abdominal pain EXAM: CT ABDOMEN AND PELVIS WITH CONTRAST TECHNIQUE: Multidetector CT imaging of the abdomen and pelvis was performed using the standard protocol following bolus administration of intravenous contrast. RADIATION DOSE REDUCTION: This exam was performed according to the departmental dose-optimization program which includes automated exposure control, adjustment of the mA and/or kV according to patient size and/or use of iterative reconstruction technique. CONTRAST:  OMNIPAQUE IOHEXOL 300 MG/ML  SOLN COMPARISON:  CT AP, 12/15/2003.  Upper GI 02/21/2016. FINDINGS: Lower chest: No acute abnormality. Hepatobiliary: No focal liver abnormality. Status post cholecystectomy. No biliary dilatation. Pancreas: No pancreatic ductal dilatation or surrounding inflammatory changes. Spleen: Normal in size without focal abnormality. Small perihilar accessory spleen Adrenals/Urinary Tract: Adrenal glands are unremarkable. Kidneys are normal, without renal calculi, focal lesion, or hydronephrosis. Bladder is unremarkable. Stomach/Bowel: Small hiatus hernia. Partial gastrectomy. Appendix is normal. A  mild-to-moderate burden of sigmoid diverticulosis is present. Pericolonic stranding with small locules of gas are present at the distal descending colon and proximal sigmoid. Trace layering fluid along the LEFT pericolic gutter. No focal drainable collection or abscess. Vascular/Lymphatic: No significant vascular findings are present. No enlarged abdominal or pelvic lymph nodes. Reproductive: Nonenlarged fibroid uterus.  adnexa are unremarkable. Other: Small fat-containing umbilical hernia. No abdominopelvic ascites. Musculoskeletal: Bilateral L5 spondylolysis with 0.5 cm anterolisthesis. No acute osseous findings. IMPRESSION: 1. Acute proximal sigmoid diverticulitis. Findings suspicious for micro perforation, as described above, however no focal drainable collection or abscess. 2. Grade 1 L5 spondylolisthesis. Additional incidental, chronic and senescent findings as above. Electronically Signed   By: Roanna Banning M.D.   On: 04/09/2022 09:54      Assessment/Plan Acute sigmoid diverticulitis without abscess Pleasant 61 y/o F who presents with 3 days of LLQ pain, anorexia, and constipation. Workup shows a leukocytosis of 14,000 and CT scan consistent acute proximal sigmoid diverticulitis with microperforation, without abscess. She is hemodynamically stable with focal RLQ tenderness. This is her first bout of diverticulitis. Recommend admission to medical service for IV abx and bowel rest. No emergent indication for surgery. CCS will follow.   I reviewed nursing notes, hospitalist notes, last 24 h vitals and pain scores, last 48 h intake and output, last 24 h labs and trends, and last 24 h imaging results.  Adam Phenix, PA-C [facility] 04/09/2022, 11:32 AM Please see Amion for pager number during day hours 7:00am-4:30pm or 7:00am -11:30am on weekends

## 2022-04-10 DIAGNOSIS — K5720 Diverticulitis of large intestine with perforation and abscess without bleeding: Secondary | ICD-10-CM | POA: Diagnosis not present

## 2022-04-10 LAB — BASIC METABOLIC PANEL
Anion gap: 11 (ref 5–15)
BUN: 8 mg/dL (ref 6–20)
CO2: 23 mmol/L (ref 22–32)
Calcium: 8.8 mg/dL — ABNORMAL LOW (ref 8.9–10.3)
Chloride: 105 mmol/L (ref 98–111)
Creatinine, Ser: 0.77 mg/dL (ref 0.44–1.00)
GFR, Estimated: >60 mL/min (ref >60)
Glucose, Bld: 105 mg/dL — ABNORMAL HIGH (ref 70–99)
Potassium: 3.4 mmol/L — ABNORMAL LOW (ref 3.5–5.1)
Sodium: 139 mmol/L (ref 135–145)

## 2022-04-10 LAB — CBC
HCT: 37.8 % (ref 36.0–46.0)
Hemoglobin: 12.7 g/dL (ref 12.0–15.0)
MCH: 29.1 pg (ref 26.0–34.0)
MCHC: 33.6 g/dL (ref 30.0–36.0)
MCV: 86.5 fL (ref 80.0–100.0)
Platelets: 274 10*3/uL (ref 150–400)
RBC: 4.37 MIL/uL (ref 3.87–5.11)
RDW: 15.2 % (ref 11.5–15.5)
WBC: 9.5 10*3/uL (ref 4.0–10.5)
nRBC: 0 % (ref 0.0–0.2)

## 2022-04-10 LAB — HIV ANTIBODY (ROUTINE TESTING W REFLEX): HIV Screen 4th Generation wRfx: NONREACTIVE

## 2022-04-10 MED ORDER — POTASSIUM CHLORIDE 10 MEQ/100ML IV SOLN
10.0000 meq | INTRAVENOUS | Status: AC
  Administered 2022-04-10 (×2): 10 meq via INTRAVENOUS
  Filled 2022-04-10 (×3): qty 100

## 2022-04-10 MED ORDER — AMOXICILLIN-POT CLAVULANATE 875-125 MG PO TABS: 1.0000 | ORAL_TABLET | Freq: Two times a day (BID) | ORAL | 0 refills | Status: AC

## 2022-04-10 NOTE — Progress Notes (Signed)
  Progress Note Patient: Bonnie Schwartz UVO:[id number] DOB: 15-Mar-1961 DOA: 04/09/2022  DOS: the patient was seen and examined on 04/10/2022  Brief hospital course: Past medical history of gastric bypass, hypothyroidism, obesity, anxiety, ADHD, GERD.  Presents to the hospital with complaints of abdominal pain found to have acute diverticulitis with microperforation.  General surgery was consulted.  Management conservative so far with IV antibiotics. Assessment and Plan: Diverticulitis with microperforation. CT abdomen shows acute proximal sigmoid diverticulitis.  No drainable collection or abscess but does have evidence of microperforation with locules of gas. Currently on IV Zosyn.  No blood cultures performed. Abdominal pain improving.  Patient ambulating without any issues.  Patient actually had a bowel movement. General surgery was consulted.  Recommended to advance the diet to soft diet and monitor. Continue pain control nausea medication.  Mild hypokalemia. Replace. Monitor.  Anxiety. Continue home regimen Wellbutrin and Ativan.  Hypothyroidism. Continue Synthroid.  Obesity. Body mass index is 35.78 kg/m.  Placing the pt at higher risk of poor outcomes.  Subjective: No nausea vomiting, had a bowel movement without any blood.  Ambulating in the hallway.  No abdominal pain.  Physical Exam: Vitals:   04/09/22 1832 04/09/22 2138 04/10/22 0429 04/10/22 0758  BP: 127/78 102/63 (!) 90/53 100/67  Pulse: 88 89 76 72  Resp: 16 16 16 16   Temp: 99.1 F (37.3 C) 98.9 F (37.2 C) 98.6 F (37 C) 98.1 F (36.7 C)  TempSrc: Oral Oral Oral Oral  SpO2: 100% 97% 94% 98%  Weight:      Height:       General: Appear in mild distress; no visible Abnormal Neck Mass Or lumps, Conjunctiva normal Cardiovascular: S1 and S2 Present, no Murmur, Respiratory: good respiratory effort, Bilateral Air entry present and CTA, no Crackles, no wheezes Abdomen: Bowel Sound present, Non tender   Extremities: no Pedal edema Neurology: alert and oriented to time, place, and person  Gait not checked due to patient safety concerns   Data Reviewed: I have Reviewed nursing notes, Vitals, and Lab results since pt's last encounter. Pertinent lab results CBC and BMP I have ordered test including CBC and BMP I have discussed pt's care plan and test results with general surgery.   Family Communication: None at bedside  Disposition: Status is: Inpatient Remains inpatient appropriate because: Monitoring for toleration of oral diet.  Author: , MD 04/10/2022 6:42 PM  Please look on [email or web address] to find out who is on call.

## 2022-04-10 NOTE — Hospital Course (Signed)
Past medical history of gastric bypass, hypothyroidism, obesity, anxiety, ADHD, GERD.  Presents to the hospital with complaints of abdominal pain found to have acute diverticulitis with microperforation.  General surgery was consulted.  Management conservative so far with IV antibiotics.

## 2022-04-10 NOTE — TOC CM/SW Note (Signed)
  Transition of Care [facility]) Screening Note   Patient Details  Name: Bonnie Schwartz Date of Birth: 1961/05/04    Transition of Care Department [facility]) has reviewed patient and no TOC needs have been identified at this time. We will continue to monitor patient advancement through interdisciplinary progression rounds. If new patient transition needs arise, please place a TOC consult.

## 2022-04-10 NOTE — Progress Notes (Signed)
   General Surgery Follow Up Note  Subjective:    Overnight Issues:   Objective:  Vital signs for last 24 hours: Temp:  [98.1 F (36.7 C)-99.1 F (37.3 C)] 98.1 F (36.7 C) (07/03 0758) Pulse Rate:  [60-89] 72 (07/03 0758) Resp:  [13-27] 16 (07/03 0758) BP: (90-133)/(53-84) 100/67 (07/03 0758) SpO2:  [94 %-100 %] 98 % (07/03 0758)  Hemodynamic parameters for last 24 hours:    Intake/Output from previous day: 07/02 0701 - 07/03 0700 In: 326.8 [P.O.:220; I.V.:106.8] Out: 2 [Urine:2]  Intake/Output this shift: No intake/output data recorded.  Vent settings for last 24 hours:    Physical Exam:  Gen: comfortable, no distress Neuro: non-focal exam HEENT: PERRL Neck: supple CV: RRR Pulm: unlabored breathing Abd: soft, RLQ TTP to deep palpation only GU: clear yellow urine Extr: wwp, no edema   Results for orders placed or performed during the hospital encounter of 04/09/22 (from the past 24 hour(s))  HIV Antibody (routine testing w rflx)     Status: None   Collection Time: 04/10/22  4:13 AM  Result Value Ref Range   HIV Screen 4th Generation wRfx Non Reactive Non Reactive  Basic metabolic panel     Status: Abnormal   Collection Time: 04/10/22  4:13 AM  Result Value Ref Range   Sodium 139 135 - 145 mmol/L   Potassium 3.4 (L) 3.5 - 5.1 mmol/L   Chloride 105 98 - 111 mmol/L   CO2 23 22 - 32 mmol/L   Glucose, Bld 105 (H) 70 - 99 mg/dL   BUN 8 6 - 20 mg/dL   Creatinine, Ser 2.69 0.44 - 1.00 mg/dL   Calcium 8.8 (L) 8.9 - 10.3 mg/dL   GFR, Estimated >48 >54 mL/min   Anion gap 11 5 - 15  CBC     Status: None   Collection Time: 04/10/22  4:13 AM  Result Value Ref Range   WBC 9.5 4.0 - 10.5 K/uL   RBC 4.37 3.87 - 5.11 MIL/uL   Hemoglobin 12.7 12.0 - 15.0 g/dL   HCT 62.7 03.5 - 00.9 %   MCV 86.5 80.0 - 100.0 fL   MCH 29.1 26.0 - 34.0 pg   MCHC 33.6 30.0 - 36.0 g/dL   RDW 38.1 82.9 - 93.7 %   Platelets 274 150 - 400 K/uL   nRBC 0.0 0.0 - 0.2 %    Assessment &  Plan: The plan of care was discussed with the bedside nurse for the day, who is in agreement with this plan and no additional concerns were raised.   Present on Admission:  Diverticulitis of colon with perforation  Anxiety  Hypothyroidism    LOS: 1 day   Additional comments:I reviewed the patient's new clinical lab test results.   and I reviewed the patients new imaging test results.    Acute sigmoid diverticulitis without abscess  continue abx, plan for 14d course total, transition to augmentin at discharge, f/u with me in the office with CT scan 5-7d, will need c-scope after resolution of this episode.  FEN - adv to soft diet DVT - SCDs, LMWH Dispo - medsurg    Diamantina Monks, MD Trauma & General Surgery Please use [email or web address] to contact on call provider  04/10/2022  *Care during the described time interval was provided by me. I have reviewed this patient's available data, including medical history, events of note, physical examination and test results as part of my evaluation.

## 2022-04-11 DIAGNOSIS — K5720 Diverticulitis of large intestine with perforation and abscess without bleeding: Secondary | ICD-10-CM | POA: Diagnosis not present

## 2022-04-11 NOTE — Progress Notes (Signed)
Patient discharged to home with instructions. 

## 2022-04-11 NOTE — Progress Notes (Signed)
Subjective/Chief Complaint: Patient with no complaints.  Tolerating diet and bowels are moving.  No abdominal pain.   Objective: Vital signs in last 24 hours: Temp:  [97.9 F (36.6 C)-98.2 F (36.8 C)] 97.9 F (36.6 C) (07/04 0829) Pulse Rate:  [71-80] 75 (07/04 0829) Resp:  [14-16] 16 (07/04 0829) BP: (95-133)/(55-83) 133/83 (07/04 0829) SpO2:  [97 %-98 %] 97 % (07/04 0829) Last BM Date : 04/08/22  Intake/Output from previous day: 07/03 0701 - 07/04 0700 In: 240 [P.O.:240] Out: -  Intake/Output this shift: No intake/output data recorded.  Abdomen: Soft nontender no rebound or guarding  Lab Results:  Recent Labs    04/09/22 0700 04/10/22 0413  WBC 14.1* 9.5  HGB 14.1 12.7  HCT 43.6 37.8  PLT 345 274   BMET Recent Labs    04/09/22 0700 04/10/22 0413  NA 136 139  K 3.7 3.4*  CL 100 105  CO2 22 23  GLUCOSE 113* 105*  BUN 9 8  CREATININE 0.83 0.77  CALCIUM 9.4 8.8*   PT/INR No results for input(s): "LABPROT", "INR" in the last 72 hours. ABG No results for input(s): "PHART", "HCO3" in the last 72 hours.  Invalid input(s): "PCO2", "PO2"  Studies/Results: CT Abdomen Pelvis W Contrast  Result Date: 04/09/2022 CLINICAL DATA:  LLQ abdominal pain EXAM: CT ABDOMEN AND PELVIS WITH CONTRAST TECHNIQUE: Multidetector CT imaging of the abdomen and pelvis was performed using the standard protocol following bolus administration of intravenous contrast. RADIATION DOSE REDUCTION: This exam was performed according to the departmental dose-optimization program which includes automated exposure control, adjustment of the mA and/or kV according to patient size and/or use of iterative reconstruction technique. CONTRAST:  OMNIPAQUE IOHEXOL 300 MG/ML  SOLN COMPARISON:  CT AP, 12/15/2003.  Upper GI 02/21/2016. FINDINGS: Lower chest: No acute abnormality. Hepatobiliary: No focal liver abnormality. Status post cholecystectomy. No biliary dilatation. Pancreas: No pancreatic  ductal dilatation or surrounding inflammatory changes. Spleen: Normal in size without focal abnormality. Small perihilar accessory spleen Adrenals/Urinary Tract: Adrenal glands are unremarkable. Kidneys are normal, without renal calculi, focal lesion, or hydronephrosis. Bladder is unremarkable. Stomach/Bowel: Small hiatus hernia. Partial gastrectomy. Appendix is normal. A mild-to-moderate burden of sigmoid diverticulosis is present. Pericolonic stranding with small locules of gas are present at the distal descending colon and proximal sigmoid. Trace layering fluid along the LEFT pericolic gutter. No focal drainable collection or abscess. Vascular/Lymphatic: No significant vascular findings are present. No enlarged abdominal or pelvic lymph nodes. Reproductive: Nonenlarged fibroid uterus.  adnexa are unremarkable. Other: Small fat-containing umbilical hernia. No abdominopelvic ascites. Musculoskeletal: Bilateral L5 spondylolysis with 0.5 cm anterolisthesis. No acute osseous findings. IMPRESSION: 1. Acute proximal sigmoid diverticulitis. Findings suspicious for micro perforation, as described above, however no focal drainable collection or abscess. 2. Grade 1 L5 spondylolisthesis. Additional incidental, chronic and senescent findings as above. Electronically Signed   By: Roanna Banning M.D.   On: 04/09/2022 09:54    Anti-infectives: Anti-infectives (From admission, onward)    Start     Dose/Rate Route Frequency Ordered Stop   04/10/22 0000  amoxicillin-clavulanate (AUGMENTIN) 875-125 MG tablet        1 tablet Oral 2 times daily 04/10/22 1526 04/24/22 2359   04/09/22 1800  piperacillin-tazobactam (ZOSYN) IVPB 3.375 g        3.375 g 12.5 mL/hr over 240 Minutes Intravenous Every 8 hours 04/09/22 1714     04/09/22 1015  piperacillin-tazobactam (ZOSYN) IVPB 3.375 g        3.375 g  100 mL/hr over 30 Minutes Intravenous  Once 04/09/22 1004 04/09/22 1324       Assessment/Plan: Acute sigmoid diverticulitis  without abscess  continue abx, plan for 14d course total, transition to augmentin at discharge, f/u with me in the office with CT scan 5-7d, will need c-scope after resolution of this episode.  FEN - adv to soft diet DVT - SCDs, LMWH Dispo -okay for discharge from surgery standpoint  Follow-up instructions in chart   LOS: 2 days  Total time 20 minutes  Dortha Schwalbe MD  04/11/2022

## 2022-04-11 NOTE — Progress Notes (Signed)
RN gave patient discharge instructions and the patient stated understanding. Medication escribed to patients Home pharmacy. Joan,RN removed the patients IV.

## 2022-04-12 ENCOUNTER — Other Ambulatory Visit: Payer: Self-pay | Admitting: Surgery

## 2022-04-12 ENCOUNTER — Other Ambulatory Visit (HOSPITAL_COMMUNITY): Payer: Self-pay | Admitting: Surgery

## 2022-04-12 DIAGNOSIS — K5720 Diverticulitis of large intestine with perforation and abscess without bleeding: Secondary | ICD-10-CM

## 2022-04-15 NOTE — Discharge Summary (Signed)
Physician Discharge Summary   Patient: Bonnie Schwartz MRN: [id number] DOB: [birth year]  Admit date:     04/09/2022  Discharge date: 04/11/2022  Discharge Physician: Lynden Oxford  PCP: Elias Else, MD  Recommendations at discharge: Follow-up with PCP in 1 week. Follow-up with GI for a colonoscopy.  Discharge Diagnoses: Principal Problem:   Diverticulitis of colon with perforation Active Problems:   Class 2 obesity due to excess calories with body mass index (BMI) of 35.0 to 35.9 in adult   Anxiety   Hypothyroidism  Hospital Course: Past medical history of gastric bypass, hypothyroidism, obesity, anxiety, ADHD, GERD.  Presents to the hospital with complaints of abdominal pain found to have acute diverticulitis with microperforation.  General surgery was consulted.  Management conservative so far with IV antibiotics.  Assessment and Plan: Diverticulitis with microperforation. CT abdomen shows acute proximal sigmoid diverticulitis.  No drainable collection or abscess but does have evidence of microperforation with locules of gas. Empirically on IV Zosyn.  No blood cultures performed. Abdominal pain improving.  Patient ambulating without any issues.  Patient actually had a bowel movement.  Was able to tolerate soft diet. We will discharge on oral Augmentin. Patient will need follow-up with general surgery with a repeat CT scan. Patient will also need follow-up with GI for colonoscopy.   Mild hypokalemia. Replaced. Monitor.   Anxiety. Continue home regimen Wellbutrin and Ativan.  Hypothyroidism. Continue Synthroid.  Obesity. Body mass index is 35.78 kg/m.  Placing the pt at higher risk of poor outcomes.  Consultants: General surgery Procedures performed:  none DISCHARGE MEDICATION: Allergies as of 04/11/2022       Reactions   Minocin [minocycline] Anaphylaxis, Swelling   Neck pain   Morphine And Related Nausea And Vomiting   Roxicodone [oxycodone] Nausea And Vomiting    Erythromycin Rash        Medication List     STOP taking these medications    bismuth subsalicylate 262 MG chewable tablet Commonly known as: PEPTO BISMOL       TAKE these medications    amoxicillin-clavulanate 875-125 MG tablet Commonly known as: AUGMENTIN Take 1 tablet by mouth 2 (two) times daily for 14 days.   buPROPion 150 MG 24 hr tablet Commonly known as: WELLBUTRIN XL Take 150 mg by mouth every morning.   cholecalciferol 1000 units tablet Commonly known as: VITAMIN D Take 1,000 Units by mouth daily.   doxylamine (Sleep) 25 MG tablet Commonly known as: UNISOM Take 25 mg by mouth at bedtime as needed for sleep.   levothyroxine 112 MCG tablet Commonly known as: SYNTHROID Take 112 mcg by mouth daily before breakfast.   lisdexamfetamine 50 MG capsule Commonly known as: VYVANSE Take 50 mg by mouth daily.   loratadine 10 MG tablet Commonly known as: CLARITIN Take 10 mg by mouth daily as needed for allergies.   LORazepam 1 MG tablet Commonly known as: ATIVAN Take 1 mg by mouth daily as needed for anxiety. Reported on 02/24/2016   magnesium oxide 400 MG tablet Commonly known as: MAG-OX Take 400 mg by mouth daily.   multivitamin with minerals Tabs tablet Take 1 tablet by mouth daily.   OMEGA-3 PO Take 1 capsule by mouth daily.   omeprazole 20 MG capsule Commonly known as: PRILOSEC Take 20 mg by mouth daily.   vitamin B-12 500 MCG tablet Commonly known as: CYANOCOBALAMIN Take 500 mcg by mouth daily.        Follow-up Information     Diamantina Monks,  MD. Nyra Capes on 04/27/2022.   Specialty: Surgery Why: at 9:20 AM for follow up from recent hospital stay for diverticulitis Contact information: [address] [place] [address] [phone]         [facility] Follow up.   Why: I ordered a CT scan for you for 04/17/22 - please call to confirm the location of your study. Contact information: [address]  [place] [address] [phone]         Elias Else, MD. Schedule an appointment as soon as possible for a visit in 1 week(s).   Specialty: Family Medicine Contact information: [phone] [address] [place] [address] [phone]                Disposition: Home Diet recommendation: Cardiac diet  Discharge Exam: Vitals:   04/10/22 0758 04/10/22 2026 04/11/22 0340 04/11/22 0829  BP: 100/67 125/81 (!) 95/55 133/83  Pulse: 72 80 71 75  Resp: 16 15 14 16   Temp: 98.1 F (36.7 C) 98.2 F (36.8 C) 98.2 F (36.8 C) 97.9 F (36.6 C)  TempSrc: Oral Oral Oral Oral  SpO2: 98% 98% 97% 97%  Weight:      Height:       General: Appear in mild distress; no visible Abnormal Neck Mass Or lumps, Conjunctiva normal Cardiovascular: S1 and S2 Present, no Murmur, Respiratory: good respiratory effort, Bilateral Air entry present and CTA, no Crackles, no wheezes Abdomen: Bowel Sound present, Non tender Extremities: no Pedal edema Neurology: alert and oriented to time, place, and person  Gait not checked due to patient safety concerns Filed Weights   04/09/22 0639  Weight: 97.5 kg   Condition at discharge: stable  The results of significant diagnostics from this hospitalization (including imaging, microbiology, ancillary and laboratory) are listed below for reference.   Imaging Studies: CT Abdomen Pelvis W Contrast  Result Date: 04/09/2022 CLINICAL DATA:  LLQ abdominal pain EXAM: CT ABDOMEN AND PELVIS WITH CONTRAST TECHNIQUE: Multidetector CT imaging of the abdomen and pelvis was performed using the standard protocol following bolus administration of intravenous contrast. RADIATION DOSE REDUCTION: This exam was performed according to the departmental dose-optimization program which includes automated exposure control, adjustment of the mA and/or kV according to patient size and/or use of iterative reconstruction technique. CONTRAST:  06/10/2022 OMNIPAQUE IOHEXOL 300  MG/ML  SOLN COMPARISON:  CT AP, 12/15/2003.  Upper GI 02/21/2016. FINDINGS: Lower chest: No acute abnormality. Hepatobiliary: No focal liver abnormality. Status post cholecystectomy. No biliary dilatation. Pancreas: No pancreatic ductal dilatation or surrounding inflammatory changes. Spleen: Normal in size without focal abnormality. Small perihilar accessory spleen Adrenals/Urinary Tract: Adrenal glands are unremarkable. Kidneys are normal, without renal calculi, focal lesion, or hydronephrosis. Bladder is unremarkable. Stomach/Bowel: Small hiatus hernia. Partial gastrectomy. Appendix is normal. A mild-to-moderate burden of sigmoid diverticulosis is present. Pericolonic stranding with small locules of gas are present at the distal descending colon and proximal sigmoid. Trace layering fluid along the LEFT pericolic gutter. No focal drainable collection or abscess. Vascular/Lymphatic: No significant vascular findings are present. No enlarged abdominal or pelvic lymph nodes. Reproductive: Nonenlarged fibroid uterus.  adnexa are unremarkable. Other: Small fat-containing umbilical hernia. No abdominopelvic ascites. Musculoskeletal: Bilateral L5 spondylolysis with 0.5 cm anterolisthesis. No acute osseous findings. IMPRESSION: 1. Acute proximal sigmoid diverticulitis. Findings suspicious for micro perforation, as described above, however no focal drainable collection or abscess. 2. Grade 1 L5 spondylolisthesis. Additional incidental, chronic and senescent findings as above. Electronically  Signed   By: Roanna Banning M.D.   On: 04/09/2022 09:54    Microbiology: Results for orders placed or performed in visit on 06/05/19  Novel Coronavirus, NAA ([facility])     Status: None   Collection Time: 06/05/19 12:00 AM   Specimen: Oropharyngeal(OP) collection in vial transport medium   OROPHARYNGEA  TESTING  Result Value Ref Range Status   SARS-CoV-2, NAA Not Detected Not Detected Final    Comment: This test was developed and  its performance characteristics determined by [employer]. This test has not been FDA cleared or approved. This test has been authorized by FDA under an Emergency Use Authorization (EUA). This test is only authorized for the duration of time the declaration that circumstances exist justifying the authorization of the emergency use of in vitro diagnostic tests for detection of SARS-CoV-2 virus and/or diagnosis of COVID-19 infection under section 564(b)(1) of the Act, 21 U.S.C. 660YTK-1(S)(0), unless the authorization is terminated or revoked sooner. When diagnostic testing is negative, the possibility of a false negative result should be considered in the context of a patient's recent exposures and the presence of clinical signs and symptoms consistent with COVID-19. An individual without symptoms of COVID-19 and who is not shedding SARS-CoV-2 virus would expect to have a negative (not detected) result in this assay.     Labs: CBC: Recent Labs  Lab 04/09/22 0700 04/10/22 0413  WBC 14.1* 9.5  HGB 14.1 12.7  HCT 43.6 37.8  MCV 87.4 86.5  PLT 345 274   Basic Metabolic Panel: Recent Labs  Lab 04/09/22 0700 04/10/22 0413  NA 136 139  K 3.7 3.4*  CL 100 105  CO2 22 23  GLUCOSE 113* 105*  BUN 9 8  CREATININE 0.83 0.77  CALCIUM 9.4 8.8*   Liver Function Tests: Recent Labs  Lab 04/09/22 0700  AST 22  ALT 24  ALKPHOS 80  BILITOT 1.0  PROT 7.3  ALBUMIN 3.7   CBG: No results for input(s): "GLUCAP" in the last 168 hours.  Discharge time spent: greater than 30 minutes.  Signed: Lynden Oxford, MD [facility] 04/11/2022

## 2022-04-17 ENCOUNTER — Encounter (HOSPITAL_COMMUNITY): Payer: Self-pay

## 2022-04-17 ENCOUNTER — Other Ambulatory Visit (HOSPITAL_COMMUNITY): Payer: BC Managed Care – PPO

## 2023-11-23 ENCOUNTER — Other Ambulatory Visit (HOSPITAL_COMMUNITY): Payer: Self-pay | Admitting: Family Medicine

## 2023-11-23 DIAGNOSIS — E7800 Pure hypercholesterolemia, unspecified: Secondary | ICD-10-CM

## 2023-12-05 ENCOUNTER — Ambulatory Visit (HOSPITAL_COMMUNITY)
Admission: RE | Admit: 2023-12-05 | Discharge: 2023-12-05 | Disposition: A | Discharge status: Home / Self Care | Payer: Self-pay | Source: Ambulatory Visit | Attending: Family Medicine | Admitting: Family Medicine

## 2023-12-05 DIAGNOSIS — E7800 Pure hypercholesterolemia, unspecified: Secondary | ICD-10-CM | POA: Insufficient documentation
